# Patient Record
Sex: Female | Born: 1965 | Race: White | Hispanic: No | Smoking: Never smoker
Health system: Southern US, Community
[De-identification: ages and names within clinical notes are randomized; demographics above are authoritative.]

## PROBLEM LIST (undated history)

## (undated) DIAGNOSIS — F419 Anxiety disorder, unspecified: Secondary | ICD-10-CM

## (undated) DIAGNOSIS — I219 Acute myocardial infarction, unspecified: Secondary | ICD-10-CM

## (undated) DIAGNOSIS — I10 Essential (primary) hypertension: Secondary | ICD-10-CM

## (undated) DIAGNOSIS — E785 Hyperlipidemia, unspecified: Secondary | ICD-10-CM

## (undated) HISTORY — DX: Essential (primary) hypertension: I10

## (undated) HISTORY — DX: Hyperlipidemia, unspecified: E78.5

## (undated) HISTORY — DX: Anxiety disorder, unspecified: F41.9

## (undated) HISTORY — DX: Acute myocardial infarction, unspecified: I21.9

---

## 1997-09-03 ENCOUNTER — Inpatient Hospital Stay (HOSPITAL_COMMUNITY): Admission: AD | Admit: 1997-09-03 | Discharge: 1997-09-03 | Payer: Self-pay | Admitting: Obstetrics and Gynecology

## 1997-09-07 ENCOUNTER — Inpatient Hospital Stay (HOSPITAL_COMMUNITY): Admission: AD | Admit: 1997-09-07 | Discharge: 1997-09-10 | Payer: Self-pay | Admitting: Obstetrics and Gynecology

## 2000-08-22 ENCOUNTER — Emergency Department (HOSPITAL_COMMUNITY): Admission: EM | Admit: 2000-08-22 | Discharge: 2000-08-22 | Payer: Self-pay | Admitting: Internal Medicine

## 2001-01-18 ENCOUNTER — Other Ambulatory Visit: Admission: RE | Admit: 2001-01-18 | Discharge: 2001-01-18 | Payer: Self-pay | Admitting: Obstetrics & Gynecology

## 2002-02-04 ENCOUNTER — Other Ambulatory Visit: Admission: RE | Admit: 2002-02-04 | Discharge: 2002-02-04 | Payer: Self-pay | Admitting: Obstetrics & Gynecology

## 2003-02-12 ENCOUNTER — Other Ambulatory Visit: Admission: RE | Admit: 2003-02-12 | Discharge: 2003-02-12 | Payer: Self-pay | Admitting: Obstetrics & Gynecology

## 2004-03-01 ENCOUNTER — Other Ambulatory Visit: Admission: RE | Admit: 2004-03-01 | Discharge: 2004-03-01 | Payer: Self-pay | Admitting: Obstetrics & Gynecology

## 2005-05-26 ENCOUNTER — Other Ambulatory Visit: Admission: RE | Admit: 2005-05-26 | Discharge: 2005-05-26 | Payer: Self-pay | Admitting: Obstetrics & Gynecology

## 2005-06-06 ENCOUNTER — Ambulatory Visit: Payer: Self-pay | Admitting: Internal Medicine

## 2005-09-26 ENCOUNTER — Ambulatory Visit: Payer: Self-pay | Admitting: Internal Medicine

## 2007-01-28 ENCOUNTER — Encounter: Payer: Self-pay | Admitting: *Deleted

## 2007-01-28 DIAGNOSIS — I10 Essential (primary) hypertension: Secondary | ICD-10-CM | POA: Insufficient documentation

## 2008-05-08 HISTORY — PX: CHOLECYSTECTOMY: SHX55

## 2008-12-01 ENCOUNTER — Emergency Department (HOSPITAL_BASED_OUTPATIENT_CLINIC_OR_DEPARTMENT_OTHER): Admission: EM | Admit: 2008-12-01 | Discharge: 2008-12-02 | Payer: Self-pay | Admitting: Emergency Medicine

## 2008-12-02 ENCOUNTER — Ambulatory Visit: Payer: Self-pay | Admitting: Diagnostic Radiology

## 2008-12-07 ENCOUNTER — Ambulatory Visit: Payer: Self-pay | Admitting: Internal Medicine

## 2008-12-14 ENCOUNTER — Encounter: Payer: Self-pay | Admitting: Internal Medicine

## 2008-12-16 ENCOUNTER — Encounter: Admission: RE | Admit: 2008-12-16 | Discharge: 2008-12-16 | Payer: Self-pay | Admitting: General Surgery

## 2009-01-13 ENCOUNTER — Encounter (INDEPENDENT_AMBULATORY_CARE_PROVIDER_SITE_OTHER): Payer: Self-pay | Admitting: General Surgery

## 2009-01-13 ENCOUNTER — Ambulatory Visit (HOSPITAL_COMMUNITY): Admission: RE | Admit: 2009-01-13 | Discharge: 2009-01-13 | Payer: Self-pay | Admitting: General Surgery

## 2009-02-01 ENCOUNTER — Encounter: Payer: Self-pay | Admitting: Internal Medicine

## 2009-03-22 ENCOUNTER — Ambulatory Visit: Payer: Self-pay | Admitting: Internal Medicine

## 2009-03-22 DIAGNOSIS — J309 Allergic rhinitis, unspecified: Secondary | ICD-10-CM | POA: Insufficient documentation

## 2009-03-23 LAB — CONVERTED CEMR LAB: Pap Smear: NORMAL

## 2010-01-05 ENCOUNTER — Telehealth: Payer: Self-pay | Admitting: Internal Medicine

## 2010-03-04 ENCOUNTER — Ambulatory Visit: Payer: Self-pay | Admitting: Internal Medicine

## 2010-03-04 DIAGNOSIS — E785 Hyperlipidemia, unspecified: Secondary | ICD-10-CM | POA: Insufficient documentation

## 2010-03-04 LAB — CONVERTED CEMR LAB
AST: 16 units/L (ref 0–37)
BUN: 12 mg/dL (ref 6–23)
Basophils Absolute: 0 10*3/uL (ref 0.0–0.1)
Calcium: 9.3 mg/dL (ref 8.4–10.5)
Cholesterol: 178 mg/dL (ref 0–200)
Eosinophils Absolute: 0 10*3/uL (ref 0.0–0.7)
GFR calc non Af Amer: 105.5 mL/min (ref >60)
Glucose, Bld: 76 mg/dL (ref 70–99)
HCT: 40 % (ref 36.0–46.0)
HDL: 59.6 mg/dL (ref >39.00)
Lymphocytes Relative: 28.7 % (ref 12.0–46.0)
Lymphs Abs: 2.2 10*3/uL (ref 0.7–4.0)
MCHC: 34.1 g/dL (ref 30.0–36.0)
Monocytes Relative: 7.2 % (ref 3.0–12.0)
Platelets: 202 10*3/uL (ref 150.0–400.0)
RDW: 12.9 % (ref 11.5–14.6)
TSH: 1.68 microintl units/mL (ref 0.35–5.50)
Total Bilirubin: 0.5 mg/dL (ref 0.3–1.2)
Triglycerides: 118 mg/dL (ref 0.0–149.0)
VLDL: 23.6 mg/dL (ref 0.0–40.0)

## 2010-03-04 LAB — HM MAMMOGRAPHY

## 2010-03-06 ENCOUNTER — Encounter: Payer: Self-pay | Admitting: Internal Medicine

## 2010-06-09 NOTE — Progress Notes (Signed)
Summary: diarrhea  Phone Note Call from Patient Call back at [phone]   Caller: Patient Summary of Call: Patient called c/o diarrhea,vomiting, and unable to keep anything down x yesterday. She states that she started a zpak on 8/27 by urgant care due to spider bite and afraid to take Immodium due to interaction. Pt is requesting advisement on what she may take to help. Please advise thanks Initial call taken by: Rock Nephew CMA,  January 05, 2010 8:51 AM  Follow-up for Phone Call        go to er for iv fluids Follow-up by: Etta Grandchild MD,  January 05, 2010 8:53 AM  Additional Follow-up for Phone Call Additional follow up Details #1::        Patient notified and states that she does not want to do that so she will figure something else out. Additional Follow-up by: Lucious Groves CMA,  January 05, 2010 9:18 AM

## 2010-06-09 NOTE — Assessment & Plan Note (Signed)
Summary: FU ON MEDS/NWS   Vital Signs:  Patient profile:   45 year old female Menstrual status:  regular LMP:     02/08/2010 Height:      67 inches Weight:      143 pounds BMI:     22.48 O2 Sat:      97 % on Room air Temp:     97.6 degrees F oral Pulse rate:   59 / minute Pulse rhythm:   regular Resp:     16 per minute BP sitting:   118 / 70 Cuff size:   regular  Vitals Entered By: Jarome Lamas (March 04, 2010 11:08 AM)  O2 Flow:  Room air CC: follow-up visit, Preventive Care Is Patient Diabetic? No Pain Assessment Patient in pain? no      LMP (date): 02/08/2010     Enter LMP: 02/08/2010 Last PAP Result Normal    Primary Care Teeghan Hammer:  JOnes  CC:  follow-up visit and Preventive Care.  History of Present Illness:  Follow-Up Visit      This is a 45 year old woman who presents for Follow-up visit.  The patient denies chest pain, palpitations, dizziness, syncope, low blood sugar symptoms, high blood sugar symptoms, edema, SOB, DOE, PND, and orthopnea.  Since the last visit the patient notes no new problems or concerns.  The patient reports taking meds as prescribed, monitoring BP, and dietary compliance.  When questioned about possible medication side effects, the patient notes none.    Preventive Screening-Counseling & Management  Alcohol-Tobacco     Alcohol drinks/day: 0     Alcohol Counseling: not indicated; patient does not drink     Smoking Status: never     Tobacco Counseling: not indicated; no tobacco use  Hep-HIV-STD-Contraception     Hepatitis Risk: no risk noted     HIV Risk: no risk noted     STD Risk: no risk noted      Sexual History:  currently monogamous.        Drug Use:  never.        Blood Transfusions:  no.    Medications Prior to Update: 1)  Atenolol 50 Mg Tabs (Atenolol) .... Take 1 Tablet By Mouth Once A Day 2)  Singulair 10 Mg Tabs (Montelukast Sodium) .... Take 1 Tablet By Mouth Once A Day 3)  Flonase 50 Mcg/act Susp  (Fluticasone Propionate) .... As Needed 4)  Ocella 3-0.03 Mg Tabs (Drospirenone-Ethinyl Estradiol) .... One Daily  Current Medications (verified): 1)  Atenolol 50 Mg Tabs (Atenolol) .... Take 1 Tablet By Mouth Once A Day 2)  Singulair 10 Mg Tabs (Montelukast Sodium) .... Take 1 Tablet By Mouth Once A Day 3)  Flonase 50 Mcg/act Susp (Fluticasone Propionate) .... As Needed 4)  Ocella 3-0.03 Mg Tabs (Drospirenone-Ethinyl Estradiol) .... One Daily  Allergies (verified): 1)  ! 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 Caesarean section  Family History: Reviewed history from 12/07/2008 and no changes required. Family History  of Alcoholism/Addiction Family History High cholesterol Family History Hypertension Family History of Stroke M 1st degree relative <50 Father has CABG at 37 yoa  Social History: Reviewed history from 12/07/2008 and no changes required. 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    Physical Exam  General:  alert, well-developed, well-nourished, and well-hydrated.   Head:  normocephalic and atraumatic.   Eyes:  vision grossly intact and pupils equal.   Mouth:  Oral mucosa and oropharynx without lesions or exudates.  Teeth in good repair. Neck:  supple, full ROM, and no masses.   Lungs:  Normal respiratory effort, chest expands symmetrically. Lungs are clear to auscultation, no crackles or wheezes. Heart:  Normal rate and regular rhythm. S1 and S2 normal without gallop, murmur, click, rub or other extra sounds. Abdomen:  Bowel sounds positive,abdomen soft and non-tender without masses, organomegaly or hernias noted. Msk:  normal ROM, no joint tenderness, no joint swelling, and no joint warmth.   Extremities:  No clubbing, cyanosis, edema, or deformity noted with normal full range of motion of all joints.   Skin:  turgor normal, color normal, and no rashes.   Cervical Nodes:  no anterior cervical adenopathy and no posterior cervical adenopathy.   Psych:  Cognition and judgment appear intact. Alert and cooperative with normal attention span and concentration. No apparent delusions, illusions, hallucinations   Impression & Recommendations:  Problem # 1:  HYPERLIPIDEMIA (ICD-272.4) Assessment New  Orders: Venipuncture (56213) TLB-Lipid Panel (80061-LIPID) TLB-BMP (Basic Metabolic Panel-BMET) (80048-METABOL) TLB-CBC Platelet - w/Differential (85025-CBCD) TLB-Hepatic/Liver Function Pnl (80076-HEPATIC) TLB-TSH (Thyroid Stimulating Hormone) (84443-TSH)  Problem # 2:  HYPERTENSION (ICD-401.9) Assessment: Improved  Her updated medication list for this problem includes:    Atenolol 50 Mg Tabs (Atenolol) .Marland Kitchen... Take 1 tablet by mouth once a day  Orders: Venipuncture (08657) TLB-Lipid Panel  (80061-LIPID) TLB-BMP (Basic Metabolic Panel-BMET) (80048-METABOL) TLB-CBC Platelet - w/Differential (85025-CBCD) TLB-Hepatic/Liver Function Pnl (80076-HEPATIC) TLB-TSH (Thyroid Stimulating Hormone) (84443-TSH)  BP today: 118/70 Prior BP: 112/72 (03/22/2009)  Prior 10 Yr Risk Heart Disease: Not enough information (12/07/2008)  Complete Medication List: 1)  Atenolol 50 Mg Tabs (Atenolol) .... Take 1 tablet by mouth once a day 2)  Singulair 10 Mg Tabs (Montelukast sodium) .... Take 1 tablet by mouth once a day 3)  Flonase 50 Mcg/act Susp (Fluticasone propionate) .... As needed 4)  Ocella 3-0.03 Mg Tabs (Drospirenone-ethinyl estradiol) .... One daily  PAP Screening:    Hx Cervical Dysplasia in last 5 yrs? No    3 normal PAP smears in last 5 yrs? Yes    Last PAP smear:  03/23/2009    Reviewed PAP smear recommendations:  patient defers to GYN Judy Pollman  PAP Smear Results:    Date of Exam:  03/23/2009    Results:  Normal  Mammogram Screening:    Reviewed Mammogram recommendations:  mammogram not due yet  Osteoporosis Risk Assessment:  Risk Factors for Fracture or Low Bone Density:   Race (White or Asian):     yes   Smoking status:  never  Patient Instructions: 1)  Please schedule a follow-up appointment in 4 months. 2)  It is important that you exercise regularly at least 20 minutes 5 times a week. If you develop chest pain, have severe difficulty breathing, or feel very tired , stop exercising immediately and seek medical attention. 3)  Check your Blood Pressure regularly. If it is above 140/90: you should make an appointment. Prescriptions: FLONASE 50 MCG/ACT SUSP (FLUTICASONE PROPIONATE) as needed  #1 inh x 11   Entered and Authorized by:   Etta Grandchild MD   Signed by:   Etta Grandchild MD on 03/04/2010   Method used:   Electronically to        [facility]* (retail)       [address] [place], [place]  [address]       Ph:  [phone]       Fax: [phone]   RxID:   [id number] SINGULAIR 10 MG TABS (MONTELUKAST SODIUM) Take 1 tablet by mouth once a day  #30 x 11   Entered and Authorized by:   Etta Grandchild MD   Signed by:   Etta Grandchild MD on 03/04/2010   Method used:   Electronically to        [facility]* (retail)       [address] [place], [place]  [address]       Ph: [phone]       Fax: [phone]   RxID:   [id number] ATENOLOL 50 MG TABS (ATENOLOL) Take 1 tablet by mouth once a day  #30 x 11   Entered and Authorized by:   Etta Grandchild MD   Signed by:   Etta Grandchild MD on 03/04/2010   Method used:   Electronically to        [facility]* (retail)       [address] [place], [place]  [address]       Ph: [phone]       Fax: [phone]   RxID:   [id number]    Orders Added: 1)  Venipuncture [51761] 2)  TLB-Lipid Panel [80061-LIPID] 3)  TLB-BMP (Basic Metabolic Panel-BMET) [80048-METABOL] 4)  TLB-CBC Platelet - w/Differential [85025-CBCD] 5)  TLB-Hepatic/Liver Function Pnl [80076-HEPATIC] 6)  TLB-TSH (Thyroid Stimulating Hormone) [84443-TSH] 7)  Est. Patient Level III [60737]

## 2010-06-09 NOTE — Letter (Signed)
Summary: Lipid Letter  [facility]  [address] [place], [place] [address]   Phone: [phone]  Fax: [phone]    03/06/2010  Alexandra Swanson [address] [place], [place]  [address]  Dear Alexandra Swanson:  We have carefully reviewed your last lipid profile from 03/04/2010 and the results are noted below with a summary of recommendations for lipid management.    Cholesterol:       178     Goal: <200   HDL "good" Cholesterol:   57.84     Goal: >50   LDL "bad" Cholesterol:   95     Goal: <130   Triglycerides:       118.0     Goal: <150    other labs look good as well    TLC Diet (Therapeutic Lifestyle Change): Saturated Fats & Transfatty acids should be kept < 7% of total calories ***Reduce Saturated Fats Polyunstaurated Fat can be up to 10% of total calories Monounsaturated Fat Fat can be up to 20% of total calories Total Fat should be no greater than 25-35% of total calories Carbohydrates should be 50-60% of total calories Protein should be approximately 15% of total calories Fiber should be at least 20-30 grams a day ***Increased fiber may help lower LDL Total Cholesterol should be < 200mg /day Consider adding plant stanol/sterols to diet (example: Benacol spread) ***A higher intake of unsaturated fat may reduce Triglycerides and Increase HDL    Adjunctive Measures (may lower LIPIDS and reduce risk of Heart Attack) include: Aerobic Exercise (20-30 minutes 3-4 times a week) Limit Alcohol Consumption Weight Reduction Aspirin 75-81 mg a day by mouth (if not allergic or contraindicated) Dietary Fiber 20-30 grams a day by mouth     Current Medications: 1)    Atenolol 50 Mg Tabs (Atenolol) .... Take 1 tablet by mouth once a day 2)    Singulair 10 Mg Tabs (Montelukast sodium) .... Take 1 tablet by mouth once a day 3)    Flonase 50 Mcg/act Susp (Fluticasone propionate) .... As needed 4)    Ocella 3-0.03 Mg Tabs (Drospirenone-ethinyl estradiol) .... One daily  If  you have any questions, please call. We appreciate being able to work with you.   Sincerely,    [facility] Etta Grandchild MD

## 2010-06-17 ENCOUNTER — Ambulatory Visit (INDEPENDENT_AMBULATORY_CARE_PROVIDER_SITE_OTHER): Payer: BC Managed Care – PPO | Admitting: Internal Medicine

## 2010-06-17 ENCOUNTER — Encounter: Payer: Self-pay | Admitting: Internal Medicine

## 2010-06-17 DIAGNOSIS — F411 Generalized anxiety disorder: Secondary | ICD-10-CM | POA: Insufficient documentation

## 2010-06-17 DIAGNOSIS — L259 Unspecified contact dermatitis, unspecified cause: Secondary | ICD-10-CM | POA: Insufficient documentation

## 2010-06-23 NOTE — Assessment & Plan Note (Signed)
Summary: FU/ RASH/ MOLE/NWS  #   Vital Signs:  Patient profile:   45 year old female Menstrual status:  regular Height:      67 inches Weight:      142 pounds BMI:     22.32 O2 Sat:      96 % on Room air Temp:     98.2 degrees F oral Pulse rate:   63 / minute Pulse rhythm:   regular Resp:     16 per minute BP sitting:   106 / 70  (left arm) Cuff size:   regular  Vitals Entered By: Rock Nephew CMA (June 17, 2010 8:19 AM)  O2 Flow:  Room air CC: Pt c/o bilat arm rash, possible ringworm on L buttock, and R side mole/ color change, Rash Is Patient Diabetic? No Pain Assessment Patient in pain? no        Primary Care Provider:  Zayana Salvador  CC:  Pt c/o bilat arm rash, possible ringworm on L buttock, and R side mole/ color change, and Rash.  History of Present Illness:  Rash      This is a 45 year old woman who presents with Rash.  The symptoms began 2 weeks ago.  The severity is described as mild.  The patient reports papules, itching, and scaling, but denies macules, nodules, hives, welts, pustules, blisters, ulcers, weeping, oozing, redness, increased warmth, and tenderness.  The rash is located on the right arm and left arm.  The rash is worse with heat and worse with scratching.  The patient denies the following symptoms: fever, headache, facial swelling, tongue swelling, burning, difficulty breathing, abdominal pain, nausea, vomiting, diarrhea, dizziness, sore throat, dysuria, eye symptoms, arthralgias, and vaginal discharge.  The patient denies history of recent tick bite, recent tick exposure, other insect bite, recent infection, recent antibiotic use, new medication, new clothing, new topical exposure, recent travel, pet/animal contact, thyroid disease, chronic liver disease, autoimmune disease, chronic edema, and prior STD.  She is using Lamisil Cream.  Current Medications (verified): 1)  Atenolol 50 Mg Tabs (Atenolol) .... Take 1 Tablet By Mouth Once A Day 2)  Singulair  10 Mg Tabs (Montelukast Sodium) .... Take 1 Tablet By Mouth Once A Day 3)  Flonase 50 Mcg/act Susp (Fluticasone Propionate) .... As Needed 4)  Loestrin 24 Fe 1-20 Mg-Mcg Tabs (Norethin Ace-Eth Estrad-Fe) 5)  Aspirin 81 Mg Tbec (Aspirin) 6)  Xanax 0.25 Mg Tabs (Alprazolam)  Allergies (verified): 1)  ! 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 Family History of Alcoholism/Addiction Family History High cholesterol Family History Hypertension Family History of Stroke M 1st degree relative <50 Father has CABG at 39 yoa  Social History: Reviewed history from 12/07/2008 and no changes required. 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   Psych:  Complains of anxiety; denies depression, easily angered, easily tearful, irritability, mental problems, panic attacks, sense of great danger, suicidal thoughts/plans, thoughts of violence, unusual visions or sounds, and thoughts /plans of harming others.  Physical  Exam  General:  alert, well-developed, well-nourished, and well-hydrated.   Head:  normocephalic and atraumatic.   Mouth:  Oral mucosa and oropharynx without lesions or exudates.  Teeth in good repair. Neck:  supple, full ROM, and no masses.   Lungs:  Normal respiratory effort, chest expands symmetrically. Lungs are clear to auscultation, no crackles or wheezes. Heart:  Normal rate and regular rhythm. S1 and S2 normal without gallop, murmur, click, rub or other extra sounds. Abdomen:  Bowel sounds positive,abdomen soft and non-tender without masses, organomegaly or hernias noted. Msk:  normal ROM, no joint tenderness, no joint swelling, and no joint warmth.   Pulses:  R and L carotid,radial,femoral,dorsalis pedis and posterior tibial pulses are full and equal bilaterally Extremities:  No clubbing, cyanosis, edema, or deformity noted with normal full range of motion of all joints.   Neurologic:  No cranial nerve deficits noted. Station and gait are normal. Plantar reflexes are down-going bilaterally. DTRs are symmetrical throughout. Sensory, motor and coordinative functions appear intact. Skin:  seborrheic keratosis and papular rash  with erythema and scale on both forearms and a round red macule on her left buttocks.   Cervical Nodes:  no anterior cervical adenopathy and no posterior cervical adenopathy.   Axillary Nodes:  no R axillary adenopathy and no L axillary adenopathy.   Psych:  Oriented X3, memory intact for recent and remote, normally interactive, good eye contact, not anxious appearing, not depressed appearing, not agitated, and not suicidal.     Impression & Recommendations:  Problem # 1:  ECZEMA (ICD-692.9) Assessment New  Her updated medication list for this problem includes:    Clobetasol Propionate 0.05 % Crea (Clobetasol propionate) .Marland Kitchen... Apply to aa two times a day for 14 days  Problem # 2:  ANXIETY DISORDER, GENERALIZED (ICD-300.02) Assessment: New  Her updated  medication list for this problem includes:    Xanax 0.25 Mg Tabs (Alprazolam) .Marland Kitchen... Take one by mouth two times a day as needed for anxiety  Problem # 3:  HYPERTENSION (ICD-401.9) Assessment: Improved  Her updated medication list for this problem includes:    Atenolol 50 Mg Tabs (Atenolol) .Marland Kitchen... Take 1 tablet by mouth once a day  BP today: 106/70 Prior BP: 118/70 (03/04/2010)  Prior 10 Yr Risk Heart Disease: Not enough information (12/07/2008)  Labs Reviewed: K+: 5.4 (03/04/2010) Creat: : 0.7 (03/04/2010)   Chol: 178 (03/04/2010)   HDL: 59.60 (03/04/2010)   LDL: 95 (03/04/2010)   TG: 118.0 (03/04/2010)  Complete Medication List: 1)  Atenolol 50 Mg Tabs (Atenolol) .... Take 1 tablet by mouth once a day 2)  Singulair 10 Mg Tabs (Montelukast sodium) .... Take 1 tablet by mouth once a day 3)  Flonase 50 Mcg/act Susp (Fluticasone propionate) .... As needed 4)  Loestrin 24 Fe 1-20 Mg-mcg Tabs (Norethin ace-eth estrad-fe) 5)  Aspirin 81 Mg Tbec (Aspirin) 6)  Xanax 0.25 Mg Tabs (Alprazolam) .... Take one by mouth two times a day as needed for anxiety 7)  Clobetasol Propionate 0.05 % Crea (Clobetasol propionate) .... Apply to aa two times a day for 14 days  Patient Instructions: 1)  Please schedule a follow-up appointment in 1 month. Prescriptions: CLOBETASOL PROPIONATE 0.05 % CREA (CLOBETASOL PROPIONATE)  Apply to AA two times a day for 14 days  #30 gms x 2   Entered and Authorized by:   Etta Grandchild MD   Signed by:   Etta Grandchild MD on 06/17/2010   Method used:   Print then Give to Patient   RxID:   [id number] Prudy Feeler 0.25 MG TABS (ALPRAZOLAM) Take one by mouth two times a day as needed for anxiety  #60 x 5   Entered and Authorized by:   Etta Grandchild MD   Signed by:   Etta Grandchild MD on 06/17/2010   Method used:   Print then Give to Patient   RxID:   [id number]    Orders Added: 1)  Est. Patient Level III [28413]

## 2010-08-12 LAB — DIFFERENTIAL
Basophils Absolute: 0 10*3/uL (ref 0.0–0.1)
Basophils Relative: 0 % (ref 0–1)
Eosinophils Absolute: 0 10*3/uL (ref 0.0–0.7)
Eosinophils Relative: 0 % (ref 0–5)
Neutrophils Relative %: 67 % (ref 43–77)

## 2010-08-12 LAB — COMPREHENSIVE METABOLIC PANEL
ALT: 13 U/L (ref 0–35)
AST: 19 U/L (ref 0–37)
Alkaline Phosphatase: 43 U/L (ref 39–117)
CO2: 26 mEq/L (ref 19–32)
Calcium: 8.7 mg/dL (ref 8.4–10.5)
Chloride: 106 mEq/L (ref 96–112)
GFR calc Af Amer: >60 mL/min (ref >60)
GFR calc non Af Amer: >60 mL/min (ref >60)
Glucose, Bld: 94 mg/dL (ref 70–99)
Potassium: 3.7 mEq/L (ref 3.5–5.1)
Sodium: 137 mEq/L (ref 135–145)
Total Bilirubin: 0.4 mg/dL (ref 0.3–1.2)

## 2010-08-12 LAB — CBC
Hemoglobin: 13.6 g/dL (ref 12.0–15.0)
MCHC: 33.8 g/dL (ref 30.0–36.0)
RBC: 4.63 MIL/uL (ref 3.87–5.11)
WBC: 7.3 10*3/uL (ref 4.0–10.5)

## 2010-08-14 LAB — URINALYSIS, ROUTINE W REFLEX MICROSCOPIC
Bilirubin Urine: NEGATIVE
Ketones, ur: NEGATIVE mg/dL
Nitrite: NEGATIVE
Protein, ur: NEGATIVE mg/dL
pH: 7 (ref 5.0–8.0)

## 2010-08-14 LAB — COMPREHENSIVE METABOLIC PANEL
Alkaline Phosphatase: 66 U/L (ref 39–117)
BUN: 15 mg/dL (ref 6–23)
Chloride: 103 mEq/L (ref 96–112)
Creatinine, Ser: 0.6 mg/dL (ref 0.4–1.2)
Glucose, Bld: 102 mg/dL — ABNORMAL HIGH (ref 70–99)
Potassium: 3.8 mEq/L (ref 3.5–5.1)
Total Bilirubin: 0.2 mg/dL — ABNORMAL LOW (ref 0.3–1.2)

## 2010-08-14 LAB — PREGNANCY, URINE: Preg Test, Ur: NEGATIVE

## 2010-08-14 LAB — CBC
HCT: 40.8 % (ref 36.0–46.0)
Hemoglobin: 14 g/dL (ref 12.0–15.0)
MCV: 85.1 fL (ref 78.0–100.0)
Platelets: 206 10*3/uL (ref 150–400)
RDW: 12.3 % (ref 11.5–15.5)

## 2010-08-14 LAB — DIFFERENTIAL
Basophils Absolute: 0.1 10*3/uL (ref 0.0–0.1)
Basophils Relative: 1 % (ref 0–1)
Lymphocytes Relative: 39 % (ref 12–46)
Neutro Abs: 4.4 10*3/uL (ref 1.7–7.7)
Neutrophils Relative %: 53 % (ref 43–77)

## 2010-08-14 LAB — LIPASE, BLOOD: Lipase: 86 U/L (ref 23–300)

## 2010-08-14 LAB — URINE MICROSCOPIC-ADD ON

## 2011-01-02 ENCOUNTER — Telehealth: Payer: Self-pay | Admitting: *Deleted

## 2011-01-02 NOTE — Telephone Encounter (Signed)
Spoke w/pt -she c/o 2 large mosquito bites on her foot. Swelling has decreased but she has never had bites to be as large as these and wanted to know what to look for. She will continue the otc anti itch cream and antihistamine. Advised to see MD w/fever, swelling, pain, drainage or any signs of infection or if area did not improve, pt agreed.

## 2011-01-24 ENCOUNTER — Encounter: Payer: Self-pay | Admitting: Internal Medicine

## 2011-01-26 ENCOUNTER — Encounter: Payer: Self-pay | Admitting: Internal Medicine

## 2011-01-26 ENCOUNTER — Ambulatory Visit (INDEPENDENT_AMBULATORY_CARE_PROVIDER_SITE_OTHER): Payer: No Typology Code available for payment source | Admitting: Internal Medicine

## 2011-01-26 VITALS — BP 128/80 | HR 60 | Temp 98.1°F | Resp 16 | Wt 147.0 lb

## 2011-01-26 DIAGNOSIS — I10 Essential (primary) hypertension: Secondary | ICD-10-CM

## 2011-01-26 DIAGNOSIS — J309 Allergic rhinitis, unspecified: Secondary | ICD-10-CM | POA: Insufficient documentation

## 2011-01-26 DIAGNOSIS — J3089 Other allergic rhinitis: Secondary | ICD-10-CM

## 2011-01-26 MED ORDER — METHYLPREDNISOLONE 4 MG PO KIT: PACK | ORAL | Status: AC

## 2011-01-26 NOTE — Progress Notes (Signed)
Subjective:    Patient ID: Alexandra Swanson, female    DOB: 1965-10-04, 45 y.o.   MRN: [id number]  Hypertension This is a chronic problem. The current episode started more than 1 year ago. The problem is unchanged. The problem is controlled. Associated symptoms include malaise/fatigue. Pertinent negatives include no anxiety, blurred vision, chest pain, headaches, neck pain, orthopnea, palpitations, peripheral edema, PND, shortness of breath or sweats. There are no associated agents to hypertension. Past treatments include beta blockers. The current treatment provides significant improvement. There are no compliance problems.       Review of Systems  Constitutional: Positive for malaise/fatigue and fatigue. Negative for fever, chills, diaphoresis, activity change, appetite change and unexpected weight change.  HENT: Positive for congestion, rhinorrhea, sneezing, postnasal drip and sinus pressure. Negative for hearing loss, ear pain, nosebleeds, sore throat, facial swelling, mouth sores, trouble swallowing, neck pain, neck stiffness, voice change, tinnitus and ear discharge.   Eyes: Negative.  Negative for blurred vision.  Respiratory: Negative for apnea, cough, choking, chest tightness, shortness of breath, wheezing and stridor.   Cardiovascular: Negative for chest pain, palpitations, orthopnea, leg swelling and PND.  Gastrointestinal: Negative for nausea, vomiting, abdominal pain, diarrhea, constipation, blood in stool, abdominal distention, anal bleeding and rectal pain.  Genitourinary: Negative.   Musculoskeletal: Negative for myalgias, back pain, joint swelling, arthralgias and gait problem.  Skin: Negative for color change, pallor, rash and wound.  Neurological: Negative for dizziness, tremors, seizures, syncope, facial asymmetry, speech difficulty, weakness, light-headedness, numbness and headaches.  Hematological: Negative for adenopathy. Does not bruise/bleed easily.    Psychiatric/Behavioral: Negative.        Objective:   Physical Exam  Vitals reviewed. Constitutional: She is oriented to person, place, and time. She appears well-developed and well-nourished. No distress.  HENT:  Head: Normocephalic and atraumatic. No trismus in the jaw.  Right Ear: Hearing, tympanic membrane, external ear and ear canal normal.  Left Ear: Hearing, tympanic membrane, external ear and ear canal normal. No decreased hearing is noted.  Nose: Mucosal edema and rhinorrhea present. No nose lacerations, sinus tenderness, nasal deformity, septal deviation or nasal septal hematoma. No epistaxis.  No foreign bodies. Right sinus exhibits no maxillary sinus tenderness and no frontal sinus tenderness. Left sinus exhibits no maxillary sinus tenderness and no frontal sinus tenderness.  Mouth/Throat: Oropharynx is clear and moist and mucous membranes are normal. Mucous membranes are not pale, not dry and not cyanotic. No uvula swelling. No oropharyngeal exudate, posterior oropharyngeal edema, posterior oropharyngeal erythema or tonsillar abscesses.  Eyes: Conjunctivae are normal. Right eye exhibits no discharge. Left eye exhibits no discharge. No scleral icterus.  Neck: Normal range of motion. Neck supple. No JVD present. No tracheal deviation present. No thyromegaly present.  Cardiovascular: Normal rate, regular rhythm, normal heart sounds and intact distal pulses.  Exam reveals no gallop and no friction rub.   No murmur heard. Pulmonary/Chest: Effort normal and breath sounds normal. No stridor. No respiratory distress. She has no wheezes. She has no rales. She exhibits no tenderness.  Abdominal: Soft. Bowel sounds are normal. She exhibits no distension and no mass. There is no tenderness. There is no rebound and no guarding.  Musculoskeletal: Normal range of motion. She exhibits no edema and no tenderness.  Lymphadenopathy:    She has no cervical adenopathy.  Neurological: She is oriented  to person, place, and time. She displays normal reflexes. She exhibits normal muscle tone. Coordination normal.  Skin: Skin is warm and dry. No rash  noted. She is not diaphoretic. No erythema. No pallor.  Psychiatric: She has a normal mood and affect. Her behavior is normal. Judgment and thought content normal.          Assessment & Plan:

## 2011-01-26 NOTE — Patient Instructions (Signed)
Hypertension (High Blood Pressure) As your heart beats, it forces blood through your arteries. This force is your blood pressure. If the pressure is too high, it is called hypertension (HTN) or high blood pressure. HTN is dangerous because you may have it and not know it. High blood pressure may mean that your heart has to work harder to pump blood. Your arteries may be narrow or stiff. The extra work puts you at risk for heart disease, stroke, and other problems.  Blood pressure consists of two numbers, a higher number over a lower, 110/72, for example. It is stated as "110 over 72." The ideal is below 120 for the top number (systolic) and under 80 for the bottom (diastolic). Write down your blood pressure today. You should pay close attention to your blood pressure if you have certain conditions such as:  Heart failure.  Prior heart attack.   Diabetes   Chronic kidney disease.   Prior stroke.   Multiple risk factors for heart disease.   To see if you have HTN, your blood pressure should be measured while you are seated with your arm held at the level of the heart. It should be measured at least twice. A one-time elevated blood pressure reading (especially in the Emergency Department) does not mean that you need treatment. There may be conditions in which the blood pressure is different between your right and left arms. It is important to see your caregiver soon for a recheck. Most people have essential hypertension which means that there is not a specific cause. This type of high blood pressure may be lowered by changing lifestyle factors such as:  Stress.  Smoking.   Lack of exercise.   Excessive weight.  Drug/tobacco/alcohol use.   Eating less salt.   Most people do not have symptoms from high blood pressure until it has caused damage to the body. Effective treatment can often prevent, delay or reduce that damage. TREATMENT Treatment for high blood pressure, when a cause has been  identified, is directed at the cause. There are a large number of medications to treat HTN. These fall into several categories, and your caregiver will help you select the medicines that are best for you. Medications may have side effects. You should review side effects with your caregiver. If your blood pressure stays high after you have made lifestyle changes or started on medicines,   Your medication(s) may need to be changed.   Other problems may need to be addressed.   Be certain you understand your prescriptions, and know how and when to take your medicine.   Be sure to follow up with your caregiver within the time frame advised (usually within two weeks) to have your blood pressure rechecked and to review your medications.   If you are taking more than one medicine to lower your blood pressure, make sure you know how and at what times they should be taken. Taking two medicines at the same time can result in blood pressure that is too low.  SEEK IMMEDIATE MEDICAL CARE IF YOU DEVELOP:  A severe headache, blurred or changing vision, or confusion.   Unusual weakness or numbness, or a faint feeling.   Severe chest or abdominal pain, vomiting, or breathing problems.  MAKE SURE YOU:   Understand these instructions.   Will watch your condition.   Will get help right away if you are not doing well or get worse.  Document Released: 04/24/2005 Document Re-Released: 10/12/2009 Exit[employer] Patient Information 2011 [employer].Allergic Rhinitis Allergic rhinitis is when the mucous membranes in the nose respond to allergens. Allergens are particles in the air that cause your body to have an allergic reaction. This causes you to release allergic antibodies. Through a chain of events, these eventually cause you to release histamine into the blood stream (hence the use of antihistamines). Although meant to be protective to the body, it is this release that causes your discomfort, such as frequent  sneezing, congestion and an itchy runny nose.  CAUSES The pollen allergens may come from grasses, trees, and weeds. This is seasonal allergic rhinitis, or "hay fever." Other allergens cause year-round allergic rhinitis (perennial allergic rhinitis) such as house dust mite allergen, pet dander and mold spores.  SYMPTOMS  Nasal stuffiness (congestion).   Runny, itchy nose with sneezing and tearing of the eyes.   There is often an itching of the mouth, eyes and ears.  It cannot be cured, but it can be controlled with medications. DIAGNOSIS If you are unable to determine the offending allergen, skin or blood testing may find it. TREATMENT  Avoid the allergen.   Medications and allergy shots (immunotherapy) can help.   Hay fever may often be treated with antihistamines in pill or nasal spray forms. Antihistamines block the effects of histamine. There are over-the-counter medicines that may help with nasal congestion and swelling around the eyes. Check with your caregiver before taking or giving this medicine.  If the treatment above does not work, there are many new medications your caregiver can prescribe. Stronger medications may be used if initial measures are ineffective. Desensitizing injections can be used if medications and avoidance fails. Desensitization is when a patient is given ongoing shots until the body becomes less sensitive to the allergen. Make sure you follow up with your caregiver if problems continue. SEEK MEDICAL CARE IF:   You develop fever (more than 100.5F (38.1 C).   You develop a cough that does not stop easily (persistent).   You have shortness of breath.   You start wheezing.   Symptoms interfere with normal daily activities.  Document Released: 01/17/2001 Document Re-Released: 05/16/2009 Exit[employer] Patient Information 2011 [employer]. 

## 2011-01-26 NOTE — Assessment & Plan Note (Signed)
She will take a course of medrol dose pak to treat the symptoms

## 2011-01-26 NOTE — Assessment & Plan Note (Signed)
I am concerned that the beta-blocker may be causing her fatigue so I have asked her to try to taper off of the atenolol and will consider a different BP med in the future

## 2011-02-02 ENCOUNTER — Telehealth: Payer: Self-pay | Admitting: *Deleted

## 2011-02-02 NOTE — Telephone Encounter (Signed)
Pt is attempting to "wean" off atenolol. See previous phone note, MD feels med may be causing fatigue. Pt is worried about elevated BP when taking less medication.   Spoke w/pt - she is slowly decreasing atenolol dose, no change in fatigue yet. BP this AM 145/86. Advised pt that if BP remained 140/90 or higher or she had any symptoms to come in for eval, pt agreed.

## 2011-02-02 NOTE — Telephone Encounter (Signed)
Agree with advice

## 2011-03-23 ENCOUNTER — Other Ambulatory Visit: Payer: Self-pay | Admitting: Internal Medicine

## 2011-03-24 ENCOUNTER — Ambulatory Visit (INDEPENDENT_AMBULATORY_CARE_PROVIDER_SITE_OTHER): Payer: No Typology Code available for payment source | Admitting: Internal Medicine

## 2011-03-24 ENCOUNTER — Encounter: Payer: Self-pay | Admitting: Internal Medicine

## 2011-03-24 VITALS — BP 140/92 | HR 80 | Temp 98.5°F | Resp 16 | Ht 67.0 in | Wt 148.0 lb

## 2011-03-24 DIAGNOSIS — Z23 Encounter for immunization: Secondary | ICD-10-CM

## 2011-03-24 DIAGNOSIS — I10 Essential (primary) hypertension: Secondary | ICD-10-CM

## 2011-03-24 DIAGNOSIS — J3089 Other allergic rhinitis: Secondary | ICD-10-CM

## 2011-03-24 MED ORDER — FLUTICASONE PROPIONATE 50 MCG/ACT NA SUSP: 2.0000 | NASAL | Status: DC | PRN

## 2011-03-24 MED ORDER — OLMESARTAN MEDOXOMIL 20 MG PO TABS: 20.0000 mg | ORAL_TABLET | Freq: Every day | ORAL | Status: DC

## 2011-03-24 NOTE — Assessment & Plan Note (Signed)
Continue flonase 

## 2011-03-24 NOTE — Progress Notes (Signed)
  Subjective:    Patient ID: Alexandra Swanson, female    DOB: 06/04/1965, 45 y.o.   MRN: [id number]  Hypertension This is a chronic problem. The current episode started more than 1 year ago. The problem is unchanged. The problem is uncontrolled. Pertinent negatives include no anxiety, blurred vision, chest pain, headaches, malaise/fatigue, neck pain, orthopnea, palpitations, peripheral edema, PND, shortness of breath or sweats. There are no associated agents to hypertension. Past treatments include beta blockers. The current treatment provides mild improvement. There are no compliance problems.       Review of Systems  Constitutional: Negative for fever, chills, malaise/fatigue, diaphoresis, activity change, appetite change, fatigue and unexpected weight change.  HENT: Positive for congestion, rhinorrhea and postnasal drip. Negative for sore throat, sneezing, neck pain and sinus pressure.   Eyes: Negative.  Negative for blurred vision.  Respiratory: Negative for apnea, cough, choking, chest tightness, shortness of breath, wheezing and stridor.   Cardiovascular: Negative for chest pain, palpitations, orthopnea, leg swelling and PND.  Gastrointestinal: Negative for nausea, vomiting, abdominal pain, diarrhea and constipation.  Genitourinary: Negative for dysuria, frequency, hematuria, flank pain, enuresis, difficulty urinating and dyspareunia.  Musculoskeletal: Negative for myalgias, back pain, joint swelling, arthralgias and gait problem.  Skin: Negative.   Neurological: Negative for dizziness, tremors, seizures, syncope, facial asymmetry, speech difficulty, weakness, light-headedness, numbness and headaches.  Hematological: Negative for adenopathy. Does not bruise/bleed easily.  Psychiatric/Behavioral: Negative.        Objective:   Physical Exam  Vitals reviewed. Constitutional: She is oriented to person, place, and time. She appears well-developed and well-nourished. No distress.  HENT:    Head: Normocephalic and atraumatic.  Mouth/Throat: Oropharynx is clear and moist. No oropharyngeal exudate.  Eyes: Conjunctivae are normal. Right eye exhibits no discharge. Left eye exhibits no discharge. No scleral icterus.  Neck: Normal range of motion. Neck supple. No JVD present. No tracheal deviation present. No thyromegaly present.  Cardiovascular: Normal rate, regular rhythm, normal heart sounds and intact distal pulses.  Exam reveals no gallop and no friction rub.   Pulmonary/Chest: Effort normal and breath sounds normal. No respiratory distress. She has no wheezes. She has no rales. She exhibits no tenderness.  Abdominal: Soft. Bowel sounds are normal. She exhibits no distension and no mass. There is no tenderness. There is no rebound and no guarding.  Musculoskeletal: Normal range of motion. She exhibits no edema and no tenderness.  Lymphadenopathy:    She has no cervical adenopathy.  Neurological: She is oriented to person, place, and time.  Skin: Skin is warm and dry. No rash noted. She is not diaphoretic. No erythema. No pallor.  Psychiatric: She has a normal mood and affect. Her behavior is normal. Judgment and thought content normal.          Assessment & Plan:

## 2011-03-24 NOTE — Assessment & Plan Note (Signed)
She has been able to wean off the atenolol so I will start benicar since I think she has high renin hypertension

## 2011-03-24 NOTE — Patient Instructions (Signed)

## 2011-04-07 ENCOUNTER — Telehealth: Payer: Self-pay

## 2011-04-07 NOTE — Telephone Encounter (Signed)
Pt called stating she has sxs of sinus inf - nasal pressure and pain and mild ear pressure. Pt was Rx'd Predpak in Aug but did not need it. She would like to know if it would be okay to take medication now? Okay to take with Benicar?Marland Kitchen

## 2011-04-07 NOTE — Telephone Encounter (Signed)
Yes, she can take the steroids and no it does not interact with benicar

## 2011-04-07 NOTE — Telephone Encounter (Signed)
She was Rx;d Prednisone by you in Aug but she did not take it. She would like to know if it is okay to take now for sinus infection sxs (ear and sinus pain/pressure). She also wants to be sure that it will not interact with Benicar. Pt declines OV at this time.

## 2011-04-07 NOTE — Telephone Encounter (Signed)
Pt advised of MD recommendation

## 2011-04-07 NOTE — Telephone Encounter (Signed)
Very confusing message, ask her to come in for a visit

## 2011-04-14 ENCOUNTER — Other Ambulatory Visit (INDEPENDENT_AMBULATORY_CARE_PROVIDER_SITE_OTHER): Payer: No Typology Code available for payment source

## 2011-04-14 ENCOUNTER — Ambulatory Visit (INDEPENDENT_AMBULATORY_CARE_PROVIDER_SITE_OTHER): Payer: No Typology Code available for payment source | Admitting: Internal Medicine

## 2011-04-14 ENCOUNTER — Encounter: Payer: Self-pay | Admitting: Internal Medicine

## 2011-04-14 ENCOUNTER — Telehealth: Payer: Self-pay | Admitting: *Deleted

## 2011-04-14 VITALS — BP 134/82 | HR 98 | Temp 97.0°F | Wt 146.0 lb

## 2011-04-14 DIAGNOSIS — R002 Palpitations: Secondary | ICD-10-CM

## 2011-04-14 DIAGNOSIS — I10 Essential (primary) hypertension: Secondary | ICD-10-CM

## 2011-04-14 LAB — COMPREHENSIVE METABOLIC PANEL
ALT: 16 U/L (ref 0–35)
AST: 18 U/L (ref 0–37)
Alkaline Phosphatase: 41 U/L (ref 39–117)
Potassium: 4 mEq/L (ref 3.5–5.1)
Sodium: 140 mEq/L (ref 135–145)
Total Bilirubin: 0.5 mg/dL (ref 0.3–1.2)
Total Protein: 7.2 g/dL (ref 6.0–8.3)

## 2011-04-14 LAB — TSH: TSH: 1.66 u[IU]/mL (ref 0.35–5.50)

## 2011-04-14 NOTE — Telephone Encounter (Signed)
Ask her if she will come in for an EKG 

## 2011-04-14 NOTE — Assessment & Plan Note (Signed)
Her BP is well controlled, I will recheck her lytes and renal function

## 2011-04-14 NOTE — Progress Notes (Signed)
  Subjective:    Patient ID: Alexandra Swanson, female    DOB: 1965-07-09, 45 y.o.   MRN: [id number]  Palpitations  This is a new problem. The current episode started in the past 7 days. The problem occurs intermittently. The problem has been unchanged. On average, each episode lasts 1 minute. Exacerbated by: medrol dose pak. Pertinent negatives include no anxiety, chest fullness, chest pain, coughing, diaphoresis, dizziness, fever, irregular heartbeat, malaise/fatigue, nausea, near-syncope, numbness, shortness of breath, syncope, vomiting or weakness. She has tried nothing for the symptoms.      Review of Systems  Constitutional: Negative for fever, chills, malaise/fatigue, diaphoresis, activity change, appetite change, fatigue and unexpected weight change.  HENT: Negative.   Eyes: Negative.   Respiratory: Negative for apnea, cough, choking, chest tightness, shortness of breath, wheezing and stridor.   Cardiovascular: Positive for palpitations. Negative for chest pain, leg swelling, syncope and near-syncope.  Gastrointestinal: Negative for nausea, vomiting and abdominal pain.  Genitourinary: Negative.   Musculoskeletal: Negative.   Skin: Negative for color change, pallor, rash and wound.  Neurological: Negative for dizziness, tremors, seizures, syncope, facial asymmetry, speech difficulty, weakness, light-headedness, numbness and headaches.  Hematological: Negative for adenopathy. Does not bruise/bleed easily.  Psychiatric/Behavioral: Negative.        Objective:   Physical Exam  Vitals reviewed. Constitutional: She is oriented to person, place, and time. She appears well-developed and well-nourished. No distress.  HENT:  Head: Normocephalic and atraumatic.  Mouth/Throat: Oropharynx is clear and moist. No oropharyngeal exudate.  Eyes: Conjunctivae are normal. Right eye exhibits no discharge. Left eye exhibits no discharge. No scleral icterus.  Neck: Normal range of motion. Neck supple.  No JVD present. No tracheal deviation present. No thyromegaly present.  Cardiovascular: Normal rate, regular rhythm, normal heart sounds and intact distal pulses.  Exam reveals no gallop and no friction rub.   No murmur heard. Pulmonary/Chest: Effort normal and breath sounds normal. No stridor. No respiratory distress. She has no wheezes. She has no rales. She exhibits no tenderness.  Abdominal: Soft. Bowel sounds are normal. She exhibits no distension and no mass. There is no tenderness. There is no rebound and no guarding.  Musculoskeletal: Normal range of motion. She exhibits no edema and no tenderness.  Lymphadenopathy:    She has no cervical adenopathy.  Neurological: She is oriented to person, place, and time.  Skin: Skin is warm and dry. No rash noted. She is not diaphoretic. No erythema. No pallor.  Psychiatric: She has a normal mood and affect. Her behavior is normal. Judgment and thought content normal.      Lab Results  Component Value Date   WBC 7.6 03/04/2010   HGB 13.7 03/04/2010   HCT 40.0 03/04/2010   PLT 202.0 03/04/2010   GLUCOSE 76 03/04/2010   CHOL 178 03/04/2010   TRIG 118.0 03/04/2010   HDL 59.60 03/04/2010   LDLCALC 95 03/04/2010   ALT 12 03/04/2010   AST 16 03/04/2010   NA 137 03/04/2010   K 5.4* 03/04/2010   CL 103 03/04/2010   CREATININE 0.7 03/04/2010   BUN 12 03/04/2010   CO2 28 03/04/2010   TSH 1.68 03/04/2010      Assessment & Plan:

## 2011-04-14 NOTE — Telephone Encounter (Signed)
Pt was seen by Dr. Jones today. °

## 2011-04-14 NOTE — Telephone Encounter (Signed)
Pt called and states since starting on prednisone she has been experiencing heart palpitations daily. She finished prednisone on Tues Morning and had still been having palpitations. She states symptoms are intermittent. She denies dizziness, or chest pain.

## 2011-04-14 NOTE — Patient Instructions (Signed)
Palpitations  A palpitation is the feeling that your heartbeat is irregular or is faster than normal. Although this is frightening, it usually is not serious. Palpitations may be caused by excesses of smoking, caffeine, or alcohol. They are also brought on by stress and anxiety. Sometimes, they are caused by heart disease. Unless otherwise noted, your caregiver did not find any signs of serious illness at this time. HOME CARE INSTRUCTIONS  To help prevent palpitations:  Drink decaffeinated coffee, tea, and soda pop. Avoid chocolate.   If you smoke or drink alcohol, quit or cut down as much as possible.   Reduce your stress or anxiety level. Biofeedback, yoga, or meditation will help you relax. Physical activity such as swimming, jogging, or walking also may be helpful.  SEEK MEDICAL CARE IF:   You continue to have a fast heartbeat.   Your palpitations occur more often.  SEEK IMMEDIATE MEDICAL CARE IF: You develop chest pain, shortness of breath, severe headache, dizziness, or fainting. Document Released: 04/21/2000 Document Revised: 01/04/2011 Document Reviewed: 06/21/2007 [employer] Patient Information 2012 [employer]. 

## 2011-04-14 NOTE — Assessment & Plan Note (Signed)
Her EKG shows some benign PAC's but no significant arrhythmia - I think this has been caused by the steroid therapy, I will recheck her lytes and TSH, I expect her to improve since the steroids have been stopped

## 2011-04-16 ENCOUNTER — Encounter: Payer: Self-pay | Admitting: Internal Medicine

## 2011-04-17 ENCOUNTER — Telehealth: Payer: Self-pay

## 2011-04-17 NOTE — Telephone Encounter (Signed)
Returned call to patient//[facility] advising letter mailed

## 2011-04-17 NOTE — Telephone Encounter (Signed)
Pt called for results of recent labs

## 2011-06-06 IMAGING — CT CT ABDOMEN W/O CM
2 of 4 series · 16 of 46 positions shown, 18 images · non-contrast
Comparison: None

CT ABDOMEN

CLINICAL DATA: Abdominal and upper back pain.  Question renal
stone.

CT ABDOMEN AND PELVIS WITHOUT CONTRAST
TECHNIQUE: Multidetector CT imaging of the abdomen and pelvis was
performed following the standard protocol without intravenous
contrast.

[Series 2: renal stone < 200 lbs 5.0 b31f · axial · 0.72mm/px · z∈[+867,+1247]mm · 13 of 84 slices shown, 15 images]
[im 4/84  soft-tissue]
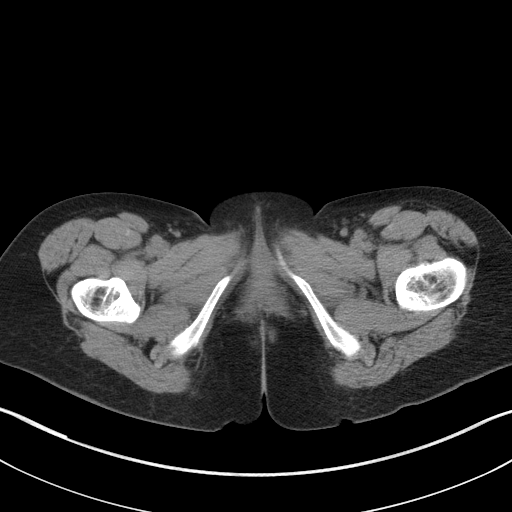
[im 4/84  bone]
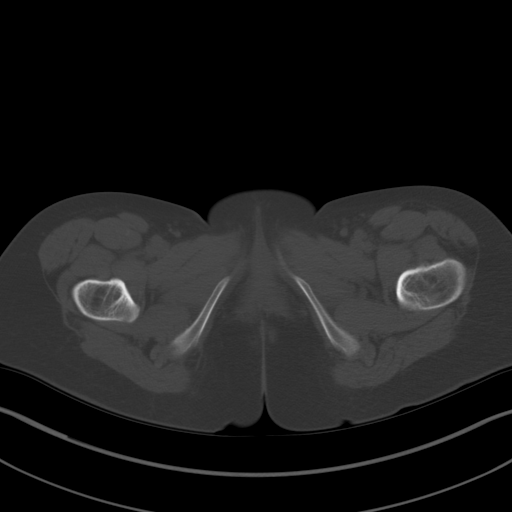
[im 10/84  soft-tissue]
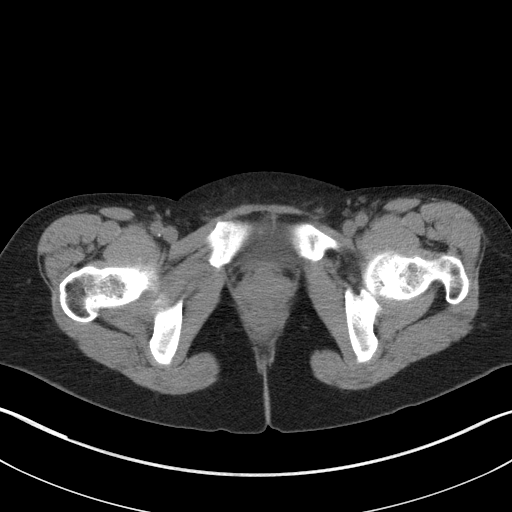
[im 17/84  soft-tissue]
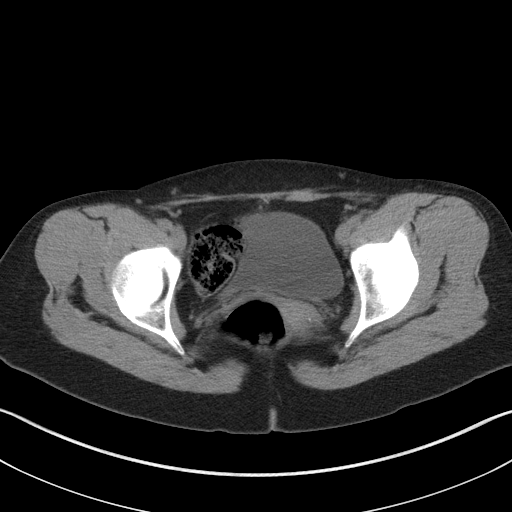
[im 24/84  soft-tissue]
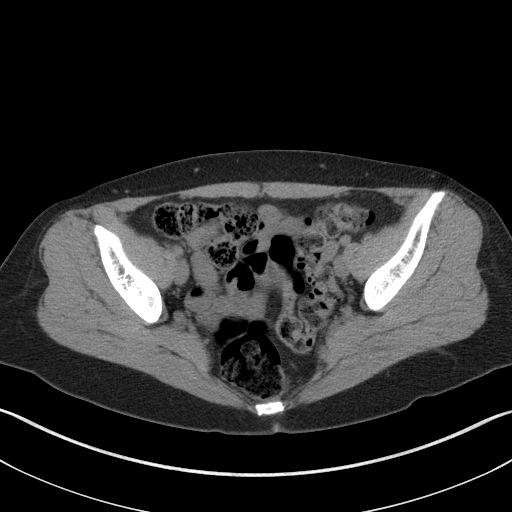
[im 30/84  soft-tissue]
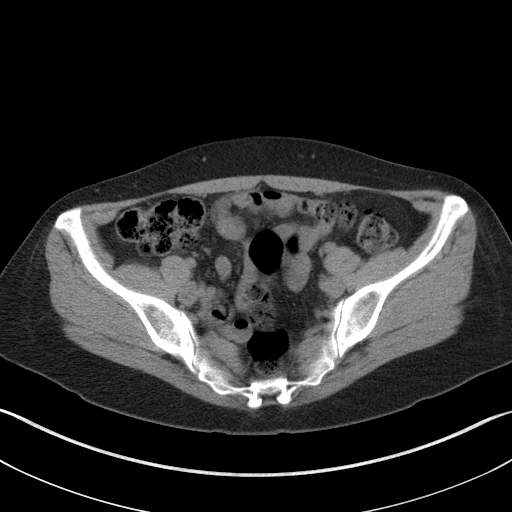
[im 37/84  soft-tissue]
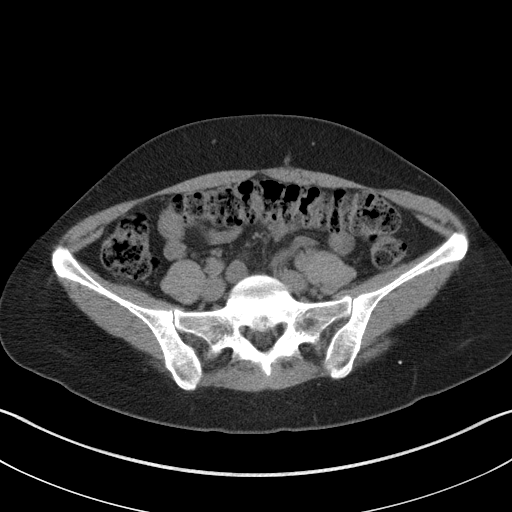
[im 44/84  soft-tissue]
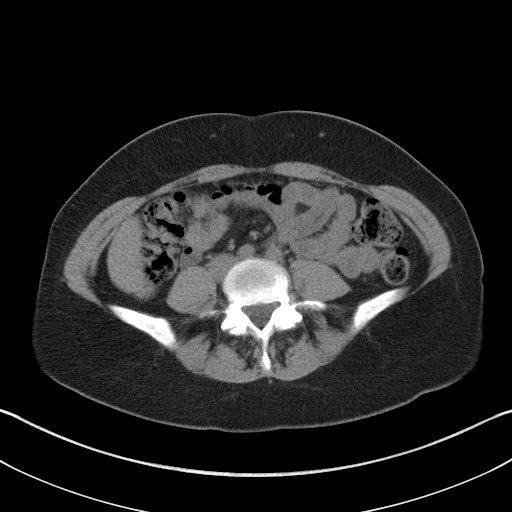
[im 47/84  soft-tissue]
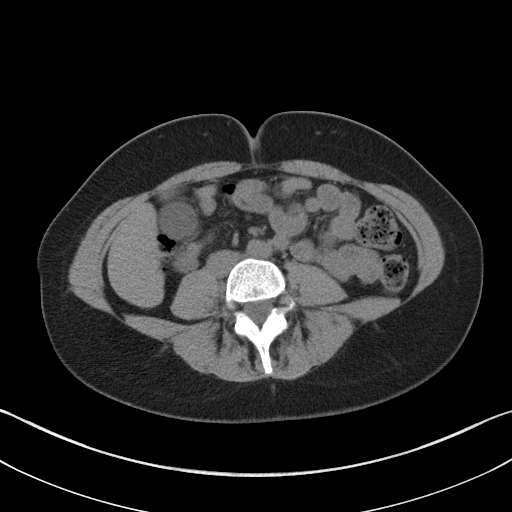
[im 54/84  soft-tissue]
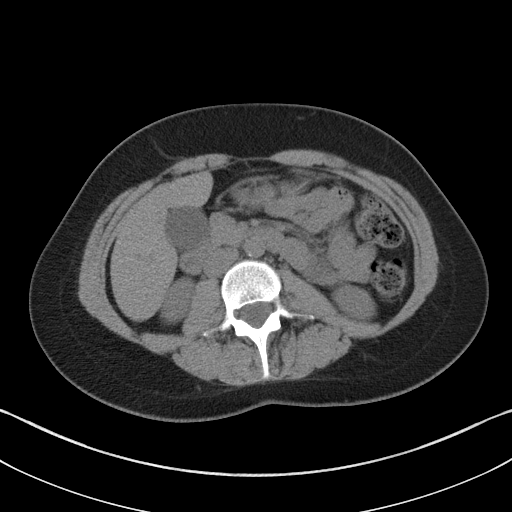
[im 54/84  bone]
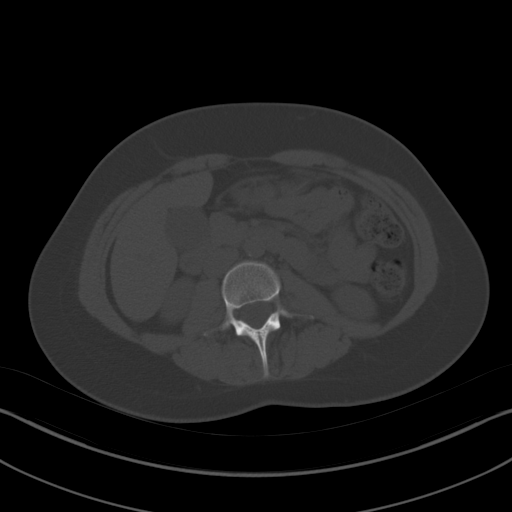
[im 60/84  soft-tissue]
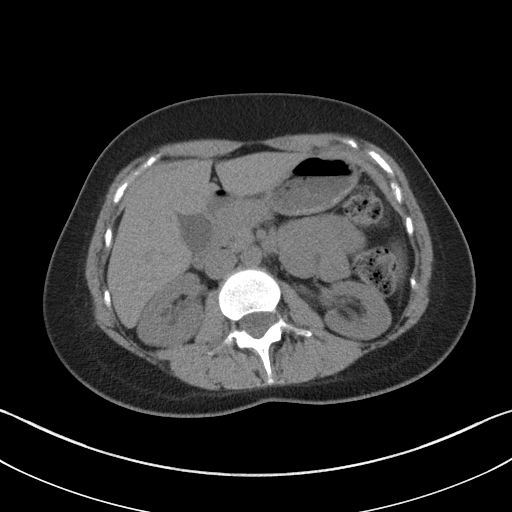
[im 67/84  soft-tissue]
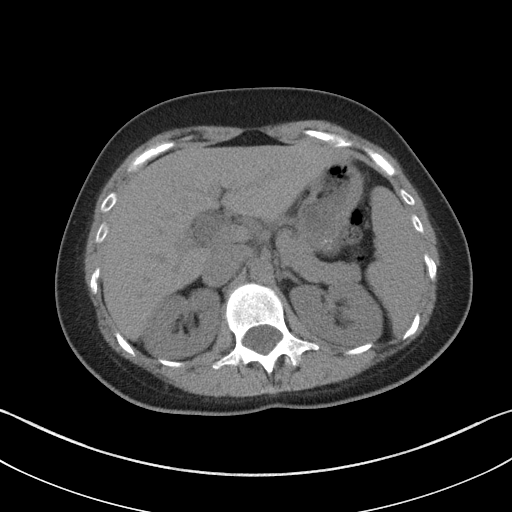
[im 74/84  soft-tissue]
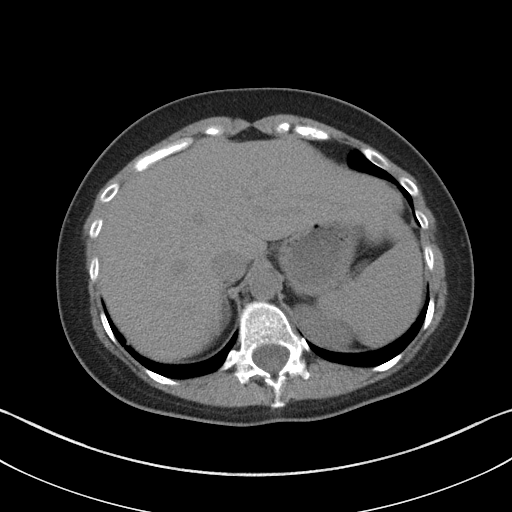
[im 80/84  soft-tissue]
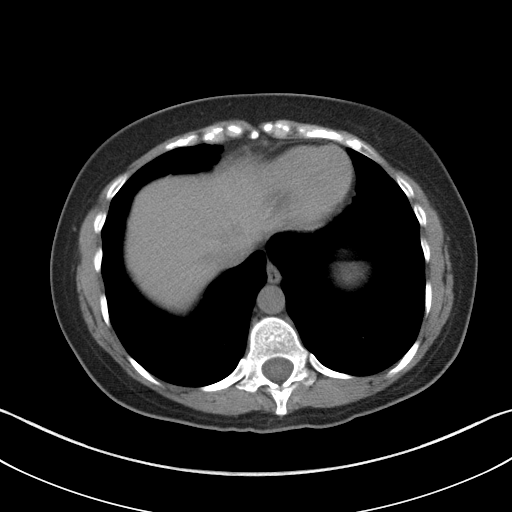

[Series 5: renal stone 3.0 coronal · coronal · 0.77mm/px · 3 of 71 slices shown]
[im 24/71  soft-tissue]
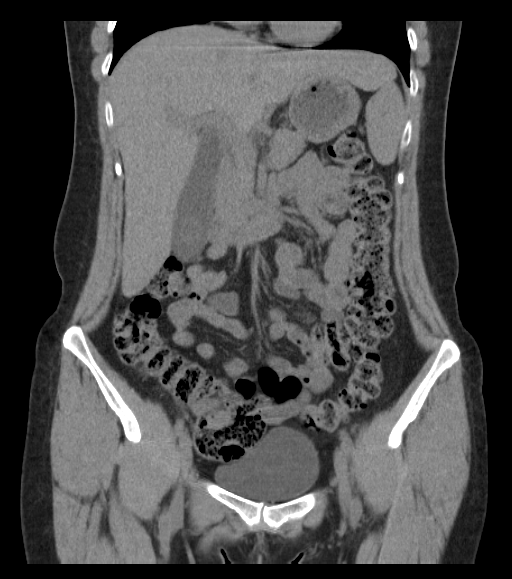
[im 32/71  soft-tissue]
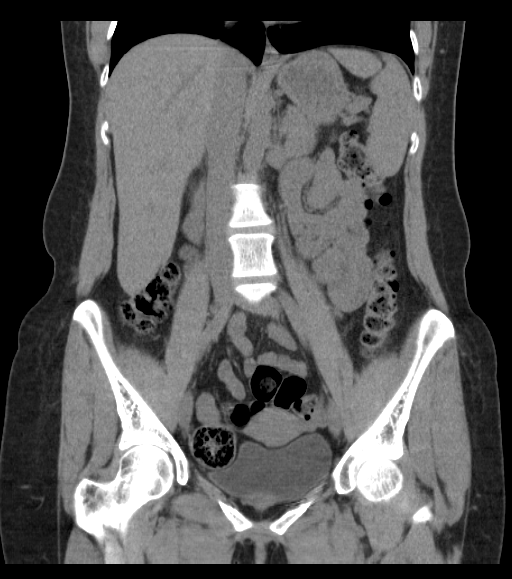
[im 39/71  soft-tissue]
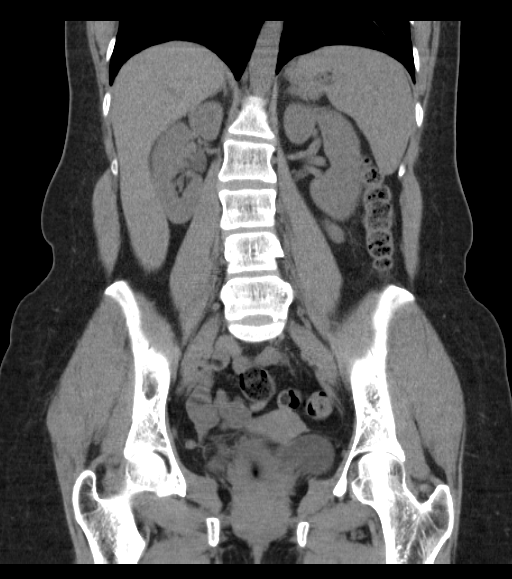

[16 of 46 positions shown; findings below may reference images not displayed]

FINDINGS: Visualized lung bases are clear.

A rounded area of increased density in the gallbladder measures
approximately 2 cm and likely represents a gallstone(s).  No
gallbladder wall thickening or pericholecystic inflammatory change
identified by CT.  No biliary ductal dilatation.

Negative for renal calculi.  Kidneys normal size and parenchymal
thickness.  There is no hydronephrosis or perinephric fluid.

Both ureters are normal in caliber.  No ureteral stone is
identified.

The unfused appearance of the liver spleen adrenal glands and
pancreas is within normal.

The stomach, small bowel loops, and colon are unremarkable.  No
bowel obstruction.  Negative for lymphadenopathy, ascites, free
air. Abdominal aorta normal in caliber and contains scattered small
foci of atherosclerotic calcification.

Visualized vertebral bodies are normal in height and alignment.  No
destructive osseous lesion.
IMPRESSION: 1.  Probable cholelithiasis. No gallbladder inflammatory changes
identified by CT.
2.  Negative for urinary tract stone disease or obstruction.

CT PELVIS
FINDINGS: Distal ureters normal in caliber.  No ureteral or
urinary bladder stone is identified.  Urinary bladder has normal
appearances.  The uterus and adnexa are unremarkable.  The cecum is
positioned in the lower aspect of the right pelvis.  The appendix
is visualized and contains a small appendicolith centrally.  The
base of the appendix measures 9 mm.  The remainder of the appendix
is normal in caliber and the distal tip of the appendix is 3-4 mm.
No peri-appendiceal fat stranding is identified.  No
lymphadenopathy, free fluid, or abscess.  Bony pelvis is intact.
IMPRESSION: 1.  The appendix is identified and contains a tiny appendicolith.
Appendiceal base upper normal to mildly prominent in size but
distal appendix is normal in caliber.  There are no periappendiceal
inflammatory changes. No definite findings to suggest acute
appendicitis, although early inflammatory changes are difficult to
exclude.  Suggest clinical correlation.
2.  Negative for urinary tract stone disease.

## 2012-02-06 ENCOUNTER — Telehealth: Payer: Self-pay | Admitting: Internal Medicine

## 2012-02-06 NOTE — Telephone Encounter (Signed)
Caller: [place]/Patient; Patient Name: Alexandra Swanson; PCP: Sanda Linger (Adults only); Best Callback Phone Number: [phone]  onset 02-04-12 of slight cough and some head congestion.  02-06-12 cough has worsened,  mostly dry but occasionally productive yellow sputum.  No fever but states she feels generally bad.  All emergent sxs per Cough protocols r/o  except for productive cough with colored sputum.  Home care advice given   Appt made for 10-2 at 0815 with Dr Yetta Barre.

## 2012-02-07 ENCOUNTER — Ambulatory Visit: Payer: No Typology Code available for payment source | Admitting: Internal Medicine

## 2012-03-28 ENCOUNTER — Telehealth: Payer: Self-pay | Admitting: Internal Medicine

## 2012-03-28 NOTE — Telephone Encounter (Signed)
Patient is calling concerning refill of her daily medication and to schedule an appointment if needed for refill.  EPIC reviewed . LOV 04/14/2011.  Advised caller that I would forward medication refill request. Transferred caller to the appointment line to schedule an appointment. PLEASE REFILL MEDICATION UNTIL PATIENT APPOINTMENT IF NEEDED.  Patient is request refill on Xanax as well.

## 2012-03-29 NOTE — Telephone Encounter (Signed)
Please specify which med, other than xanax, needs a refill

## 2012-03-29 NOTE — Telephone Encounter (Signed)
Pt advised of same via personal VM and requested to call back with name of Rx or have pharmacy request refill

## 2012-04-08 ENCOUNTER — Encounter: Payer: No Typology Code available for payment source | Admitting: Internal Medicine

## 2012-04-19 ENCOUNTER — Other Ambulatory Visit: Payer: Self-pay | Admitting: Internal Medicine

## 2012-05-02 ENCOUNTER — Other Ambulatory Visit: Payer: Self-pay | Admitting: Internal Medicine

## 2012-05-06 ENCOUNTER — Encounter: Payer: Self-pay | Admitting: Internal Medicine

## 2012-05-06 ENCOUNTER — Ambulatory Visit (INDEPENDENT_AMBULATORY_CARE_PROVIDER_SITE_OTHER): Payer: No Typology Code available for payment source | Admitting: Internal Medicine

## 2012-05-06 ENCOUNTER — Other Ambulatory Visit (INDEPENDENT_AMBULATORY_CARE_PROVIDER_SITE_OTHER): Payer: No Typology Code available for payment source

## 2012-05-06 VITALS — BP 126/82 | HR 70 | Temp 97.5°F | Resp 12 | Ht 67.0 in | Wt 124.1 lb

## 2012-05-06 DIAGNOSIS — I10 Essential (primary) hypertension: Secondary | ICD-10-CM

## 2012-05-06 DIAGNOSIS — Z0000 Encounter for general adult medical examination without abnormal findings: Secondary | ICD-10-CM

## 2012-05-06 DIAGNOSIS — J3089 Other allergic rhinitis: Secondary | ICD-10-CM

## 2012-05-06 DIAGNOSIS — F411 Generalized anxiety disorder: Secondary | ICD-10-CM

## 2012-05-06 LAB — URINALYSIS, ROUTINE W REFLEX MICROSCOPIC
Nitrite: NEGATIVE
Specific Gravity, Urine: >=1.03 (ref 1.000–1.030)
Total Protein, Urine: NEGATIVE
Urobilinogen, UA: 0.2 (ref 0.0–1.0)

## 2012-05-06 LAB — COMPREHENSIVE METABOLIC PANEL
AST: 20 U/L (ref 0–37)
Alkaline Phosphatase: 30 U/L — ABNORMAL LOW (ref 39–117)
Glucose, Bld: 82 mg/dL (ref 70–99)
Sodium: 138 mEq/L (ref 135–145)
Total Bilirubin: 0.6 mg/dL (ref 0.3–1.2)
Total Protein: 7.2 g/dL (ref 6.0–8.3)

## 2012-05-06 LAB — LIPID PANEL
HDL: 51.4 mg/dL (ref >39.00)
LDL Cholesterol: 93 mg/dL (ref 0–99)
Total CHOL/HDL Ratio: 3
VLDL: 22.6 mg/dL (ref 0.0–40.0)

## 2012-05-06 MED ORDER — ALPRAZOLAM 0.25 MG PO TABS: 0.2500 mg | ORAL_TABLET | Freq: Two times a day (BID) | ORAL | Status: DC | PRN

## 2012-05-06 MED ORDER — FLUTICASONE PROPIONATE 50 MCG/ACT NA SUSP: 2.0000 | Freq: Every day | NASAL | Status: DC

## 2012-05-06 NOTE — Assessment & Plan Note (Signed)
Exam done Vaccines were reviewed Labs ordered Pt ed material was given 

## 2012-05-06 NOTE — Progress Notes (Signed)
  Subjective:    Patient ID: Alexandra Swanson, female    DOB: November 06, 1965, 46 y.o.   MRN: [id number]  Hypertension This is a chronic problem. The current episode started more than 1 year ago. The problem has been gradually improving since onset. The problem is controlled. Associated symptoms include anxiety. Pertinent negatives include no blurred vision, chest pain, headaches, malaise/fatigue, neck pain, orthopnea, palpitations, peripheral edema, PND, shortness of breath or sweats. Past treatments include angiotensin blockers. The current treatment provides significant improvement. There are no compliance problems.       Review of Systems  Constitutional: Negative.  Negative for malaise/fatigue.  HENT: Negative.  Negative for neck pain.   Eyes: Negative.  Negative for blurred vision.  Respiratory: Negative for apnea, cough, choking, chest tightness, shortness of breath and stridor.   Cardiovascular: Negative for chest pain, palpitations, orthopnea, leg swelling and PND.  Gastrointestinal: Negative.   Genitourinary: Negative.   Musculoskeletal: Negative.   Skin: Negative.   Neurological: Negative.  Negative for headaches.  Hematological: Negative for adenopathy. Does not bruise/bleed easily.  Psychiatric/Behavioral: Negative for suicidal ideas, hallucinations, behavioral problems, confusion, sleep disturbance, self-injury, dysphoric mood, decreased concentration and agitation. The patient is nervous/anxious. The patient is not hyperactive.        Objective:   Physical Exam  Vitals reviewed. Constitutional: She is oriented to person, place, and time. She appears well-developed and well-nourished. No distress.  HENT:  Head: Normocephalic and atraumatic.  Mouth/Throat: Oropharynx is clear and moist. No oropharyngeal exudate.  Eyes: Conjunctivae normal are normal. Right eye exhibits no discharge. Left eye exhibits no discharge. No scleral icterus.  Neck: Normal range of motion. Neck supple.  No JVD present. No tracheal deviation present. No thyromegaly present.  Cardiovascular: Normal rate, regular rhythm, normal heart sounds and intact distal pulses.  Exam reveals no gallop and no friction rub.   No murmur heard. Pulmonary/Chest: Effort normal and breath sounds normal. No stridor. No respiratory distress. She has no wheezes. She has no rales. She exhibits no tenderness.  Abdominal: Soft. Bowel sounds are normal. She exhibits no distension and no mass. There is no tenderness. There is no rebound and no guarding.  Musculoskeletal: Normal range of motion. She exhibits no edema and no tenderness.  Lymphadenopathy:    She has no cervical adenopathy.  Neurological: She is oriented to person, place, and time.  Skin: Skin is warm and dry. No rash noted. She is not diaphoretic. No erythema. No pallor.  Psychiatric: She has a normal mood and affect. Her behavior is normal. Judgment and thought content normal.     Lab Results  Component Value Date   WBC 7.6 03/04/2010   HGB 13.7 03/04/2010   HCT 40.0 03/04/2010   PLT 202.0 03/04/2010   GLUCOSE 131* 04/14/2011   CHOL 178 03/04/2010   TRIG 118.0 03/04/2010   HDL 59.60 03/04/2010   LDLCALC 95 03/04/2010   ALT 16 04/14/2011   AST 18 04/14/2011   NA 140 04/14/2011   K 4.0 04/14/2011   CL 103 04/14/2011   CREATININE 0.7 04/14/2011   BUN 11 04/14/2011   CO2 27 04/14/2011   TSH 1.66 04/14/2011       Assessment & Plan:

## 2012-05-06 NOTE — Patient Instructions (Signed)
Preventive Care for Adults, Female A healthy lifestyle and preventive care can promote health and wellness. Preventive health guidelines for women include the following key practices.  A routine yearly physical is a good way to check with your caregiver about your health and preventive screening. It is a chance to share any concerns and updates on your health, and to receive a thorough exam.  Visit your dentist for a routine exam and preventive care every 6 months. Brush your teeth twice a day and floss once a day. Good oral hygiene prevents tooth decay and gum disease.  The frequency of eye exams is based on your age, health, family medical history, use of contact lenses, and other factors. Follow your caregiver's recommendations for frequency of eye exams.  Eat a healthy diet. Foods like vegetables, fruits, whole grains, low-fat dairy products, and lean protein foods contain the nutrients you need without too many calories. Decrease your intake of foods high in solid fats, added sugars, and salt. Eat the right amount of calories for you.Get information about a proper diet from your caregiver, if necessary.  Regular physical exercise is one of the most important things you can do for your health. Most adults should get at least 150 minutes of moderate-intensity exercise (any activity that increases your heart rate and causes you to sweat) each week. In addition, most adults need muscle-strengthening exercises on 2 or more days a week.  Maintain a healthy weight. The body mass index (BMI) is a screening tool to identify possible weight problems. It provides an estimate of body fat based on height and weight. Your caregiver can help determine your BMI, and can help you achieve or maintain a healthy weight.For adults 20 years and older:  A BMI below 18.5 is considered underweight.  A BMI of 18.5 to 24.9 is normal.  A BMI of 25 to 29.9 is considered overweight.  A BMI of 30 and above is  considered obese.  Maintain normal blood lipids and cholesterol levels by exercising and minimizing your intake of saturated fat. Eat a balanced diet with plenty of fruit and vegetables. Blood tests for lipids and cholesterol should begin at age 20 and be repeated every 5 years. If your lipid or cholesterol levels are high, you are over 50, or you are at high risk for heart disease, you may need your cholesterol levels checked more frequently.Ongoing high lipid and cholesterol levels should be treated with medicines if diet and exercise are not effective.  If you smoke, find out from your caregiver how to quit. If you do not use tobacco, do not start.  If you are pregnant, do not drink alcohol. If you are breastfeeding, be very cautious about drinking alcohol. If you are not pregnant and choose to drink alcohol, do not exceed 1 drink per day. One drink is considered to be 12 ounces (355 mL) of beer, 5 ounces (148 mL) of wine, or 1.5 ounces (44 mL) of liquor.  Avoid use of street drugs. Do not share needles with anyone. Ask for help if you need support or instructions about stopping the use of drugs.  High blood pressure causes heart disease and increases the risk of stroke. Your blood pressure should be checked at least every 1 to 2 years. Ongoing high blood pressure should be treated with medicines if weight loss and exercise are not effective.  If you are 55 to 45 years old, ask your caregiver if you should take aspirin to prevent strokes.  Diabetes   screening involves taking a blood sample to check your fasting blood sugar level. This should be done once every 3 years, after age 45, if you are within normal weight and without risk factors for diabetes. Testing should be considered at a younger age or be carried out more frequently if you are overweight and have at least 1 risk factor for diabetes.  Breast cancer screening is essential preventive care for women. You should practice "breast  self-awareness." This means understanding the normal appearance and feel of your breasts and may include breast self-examination. Any changes detected, no matter how small, should be reported to a caregiver. Women in their 20s and 30s should have a clinical breast exam (CBE) by a caregiver as part of a regular health exam every 1 to 3 years. After age 40, women should have a CBE every year. Starting at age 40, women should consider having a mammography (breast X-ray test) every year. Women who have a family history of breast cancer should talk to their caregiver about genetic screening. Women at a high risk of breast cancer should talk to their caregivers about having magnetic resonance imaging (MRI) and a mammography every year.  The Pap test is a screening test for cervical cancer. A Pap test can show cell changes on the cervix that might become cervical cancer if left untreated. A Pap test is a procedure in which cells are obtained and examined from the lower end of the uterus (cervix).  Women should have a Pap test starting at age 21.  Between ages 21 and 29, Pap tests should be repeated every 2 years.  Beginning at age 30, you should have a Pap test every 3 years as long as the past 3 Pap tests have been normal.  Some women have medical problems that increase the chance of getting cervical cancer. Talk to your caregiver about these problems. It is especially important to talk to your caregiver if a new problem develops soon after your last Pap test. In these cases, your caregiver may recommend more frequent screening and Pap tests.  The above recommendations are the same for women who have or have not gotten the vaccine for human papillomavirus (HPV).  If you had a hysterectomy for a problem that was not cancer or a condition that could lead to cancer, then you no longer need Pap tests. Even if you no longer need a Pap test, a regular exam is a good idea to make sure no other problems are  starting.  If you are between ages 65 and 70, and you have had normal Pap tests going back 10 years, you no longer need Pap tests. Even if you no longer need a Pap test, a regular exam is a good idea to make sure no other problems are starting.  If you have had past treatment for cervical cancer or a condition that could lead to cancer, you need Pap tests and screening for cancer for at least 20 years after your treatment.  If Pap tests have been discontinued, risk factors (such as a new sexual partner) need to be reassessed to determine if screening should be resumed.  The HPV test is an additional test that may be used for cervical cancer screening. The HPV test looks for the virus that can cause the cell changes on the cervix. The cells collected during the Pap test can be tested for HPV. The HPV test could be used to screen women aged 30 years and older, and should   be used in women of any age who have unclear Pap test results. After the age of 30, women should have HPV testing at the same frequency as a Pap test.  Colorectal cancer can be detected and often prevented. Most routine colorectal cancer screening begins at the age of 50 and continues through age 75. However, your caregiver may recommend screening at an earlier age if you have risk factors for colon cancer. On a yearly basis, your caregiver may provide home test kits to check for hidden blood in the stool. Use of a small camera at the end of a tube, to directly examine the colon (sigmoidoscopy or colonoscopy), can detect the earliest forms of colorectal cancer. Talk to your caregiver about this at age 50, when routine screening begins. Direct examination of the colon should be repeated every 5 to 10 years through age 75, unless early forms of pre-cancerous polyps or small growths are found.  Hepatitis C blood testing is recommended for all people born from 1945 through 1965 and any individual with known risks for hepatitis C.  Practice  safe sex. Use condoms and avoid high-risk sexual practices to reduce the spread of sexually transmitted infections (STIs). STIs include gonorrhea, chlamydia, syphilis, trichomonas, herpes, HPV, and human immunodeficiency virus (HIV). Herpes, HIV, and HPV are viral illnesses that have no cure. They can result in disability, cancer, and death. Sexually active women aged 25 and younger should be checked for chlamydia. Older women with new or multiple partners should also be tested for chlamydia. Testing for other STIs is recommended if you are sexually active and at increased risk.  Osteoporosis is a disease in which the bones lose minerals and strength with aging. This can result in serious bone fractures. The risk of osteoporosis can be identified using a bone density scan. Women ages 65 and over and women at risk for fractures or osteoporosis should discuss screening with their caregivers. Ask your caregiver whether you should take a calcium supplement or vitamin D to reduce the rate of osteoporosis.  Menopause can be associated with physical symptoms and risks. Hormone replacement therapy is available to decrease symptoms and risks. You should talk to your caregiver about whether hormone replacement therapy is right for you.  Use sunscreen with sun protection factor (SPF) of 30 or more. Apply sunscreen liberally and repeatedly throughout the day. You should seek shade when your shadow is shorter than you. Protect yourself by wearing long sleeves, pants, a wide-brimmed hat, and sunglasses year round, whenever you are outdoors.  Once a month, do a whole body skin exam, using a mirror to look at the skin on your back. Notify your caregiver of new moles, moles that have irregular borders, moles that are larger than a pencil eraser, or moles that have changed in shape or color.  Stay current with required immunizations.  Influenza. You need a dose every fall (or winter). The composition of the flu vaccine  changes each year, so being vaccinated once is not enough.  Pneumococcal polysaccharide. You need 1 to 2 doses if you smoke cigarettes or if you have certain chronic medical conditions. You need 1 dose at age 65 (or older) if you have never been vaccinated.  Tetanus, diphtheria, pertussis (Tdap, Td). Get 1 dose of Tdap vaccine if you are younger than age 65, are over 65 and have contact with an infant, are a healthcare worker, are pregnant, or simply want to be protected from whooping cough. After that, you need a Td   booster dose every 10 years. Consult your caregiver if you have not had at least 3 tetanus and diphtheria-containing shots sometime in your life or have a deep or dirty wound.  HPV. You need this vaccine if you are a woman age 26 or younger. The vaccine is given in 3 doses over 6 months.  Measles, mumps, rubella (MMR). You need at least 1 dose of MMR if you were born in 1957 or later. You may also need a second dose.  Meningococcal. If you are age 19 to 21 and a first-year college student living in a residence hall, or have one of several medical conditions, you need to get vaccinated against meningococcal disease. You may also need additional booster doses.  Zoster (shingles). If you are age 60 or older, you should get this vaccine.  Varicella (chickenpox). If you have never had chickenpox or you were vaccinated but received only 1 dose, talk to your caregiver to find out if you need this vaccine.  Hepatitis A. You need this vaccine if you have a specific risk factor for hepatitis A virus infection or you simply wish to be protected from this disease. The vaccine is usually given as 2 doses, 6 to 18 months apart.  Hepatitis B. You need this vaccine if you have a specific risk factor for hepatitis B virus infection or you simply wish to be protected from this disease. The vaccine is given in 3 doses, usually over 6 months. Preventive Services / Frequency Ages 19 to 39  Blood  pressure check.** / Every 1 to 2 years.  Lipid and cholesterol check.** / Every 5 years beginning at age 20.  Clinical breast exam.** / Every 3 years for women in their 20s and 30s.  Pap test.** / Every 2 years from ages 21 through 29. Every 3 years starting at age 30 through age 65 or 70 with a history of 3 consecutive normal Pap tests.  HPV screening.** / Every 3 years from ages 30 through ages 65 to 70 with a history of 3 consecutive normal Pap tests.  Hepatitis C blood test.** / For any individual with known risks for hepatitis C.  Skin self-exam. / Monthly.  Influenza immunization.** / Every year.  Pneumococcal polysaccharide immunization.** / 1 to 2 doses if you smoke cigarettes or if you have certain chronic medical conditions.  Tetanus, diphtheria, pertussis (Tdap, Td) immunization. / A one-time dose of Tdap vaccine. After that, you need a Td booster dose every 10 years.  HPV immunization. / 3 doses over 6 months, if you are 26 and younger.  Measles, mumps, rubella (MMR) immunization. / You need at least 1 dose of MMR if you were born in 1957 or later. You may also need a second dose.  Meningococcal immunization. / 1 dose if you are age 19 to 21 and a first-year college student living in a residence hall, or have one of several medical conditions, you need to get vaccinated against meningococcal disease. You may also need additional booster doses.  Varicella immunization.** / Consult your caregiver.  Hepatitis A immunization.** / Consult your caregiver. 2 doses, 6 to 18 months apart.  Hepatitis B immunization.** / Consult your caregiver. 3 doses usually over 6 months. Ages 40 to 64  Blood pressure check.** / Every 1 to 2 years.  Lipid and cholesterol check.** / Every 5 years beginning at age 20.  Clinical breast exam.** / Every year after age 40.  Mammogram.** / Every year beginning at age 40   and continuing for as long as you are in good health. Consult with your  caregiver.  Pap test.** / Every 3 years starting at age 30 through age 65 or 70 with a history of 3 consecutive normal Pap tests.  HPV screening.** / Every 3 years from ages 30 through ages 65 to 70 with a history of 3 consecutive normal Pap tests.  Fecal occult blood test (FOBT) of stool. / Every year beginning at age 50 and continuing until age 75. You may not need to do this test if you get a colonoscopy every 10 years.  Flexible sigmoidoscopy or colonoscopy.** / Every 5 years for a flexible sigmoidoscopy or every 10 years for a colonoscopy beginning at age 50 and continuing until age 75.  Hepatitis C blood test.** / For all people born from 1945 through 1965 and any individual with known risks for hepatitis C.  Skin self-exam. / Monthly.  Influenza immunization.** / Every year.  Pneumococcal polysaccharide immunization.** / 1 to 2 doses if you smoke cigarettes or if you have certain chronic medical conditions.  Tetanus, diphtheria, pertussis (Tdap, Td) immunization.** / A one-time dose of Tdap vaccine. After that, you need a Td booster dose every 10 years.  Measles, mumps, rubella (MMR) immunization. / You need at least 1 dose of MMR if you were born in 1957 or later. You may also need a second dose.  Varicella immunization.** / Consult your caregiver.  Meningococcal immunization.** / Consult your caregiver.  Hepatitis A immunization.** / Consult your caregiver. 2 doses, 6 to 18 months apart.  Hepatitis B immunization.** / Consult your caregiver. 3 doses, usually over 6 months. Ages 65 and over  Blood pressure check.** / Every 1 to 2 years.  Lipid and cholesterol check.** / Every 5 years beginning at age 20.  Clinical breast exam.** / Every year after age 40.  Mammogram.** / Every year beginning at age 40 and continuing for as long as you are in good health. Consult with your caregiver.  Pap test.** / Every 3 years starting at age 30 through age 65 or 70 with a 3  consecutive normal Pap tests. Testing can be stopped between 65 and 70 with 3 consecutive normal Pap tests and no abnormal Pap or HPV tests in the past 10 years.  HPV screening.** / Every 3 years from ages 30 through ages 65 or 70 with a history of 3 consecutive normal Pap tests. Testing can be stopped between 65 and 70 with 3 consecutive normal Pap tests and no abnormal Pap or HPV tests in the past 10 years.  Fecal occult blood test (FOBT) of stool. / Every year beginning at age 50 and continuing until age 75. You may not need to do this test if you get a colonoscopy every 10 years.  Flexible sigmoidoscopy or colonoscopy.** / Every 5 years for a flexible sigmoidoscopy or every 10 years for a colonoscopy beginning at age 50 and continuing until age 75.  Hepatitis C blood test.** / For all people born from 1945 through 1965 and any individual with known risks for hepatitis C.  Osteoporosis screening.** / A one-time screening for women ages 65 and over and women at risk for fractures or osteoporosis.  Skin self-exam. / Monthly.  Influenza immunization.** / Every year.  Pneumococcal polysaccharide immunization.** / 1 dose at age 65 (or older) if you have never been vaccinated.  Tetanus, diphtheria, pertussis (Tdap, Td) immunization. / A one-time dose of Tdap vaccine if you are over   65 and have contact with an infant, are a healthcare worker, or simply want to be protected from whooping cough. After that, you need a Td booster dose every 10 years.  Varicella immunization.** / Consult your caregiver.  Meningococcal immunization.** / Consult your caregiver.  Hepatitis A immunization.** / Consult your caregiver. 2 doses, 6 to 18 months apart.  Hepatitis B immunization.** / Check with your caregiver. 3 doses, usually over 6 months. ** Family history and personal history of risk and conditions may change your caregiver's recommendations. Document Released: 06/20/2001 Document Revised: 07/17/2011  Document Reviewed: 09/19/2010 [employer] Patient Information 2013 [employer].  

## 2012-05-07 ENCOUNTER — Encounter: Payer: Self-pay | Admitting: Internal Medicine

## 2012-05-07 LAB — CBC WITH DIFFERENTIAL/PLATELET
Basophils Relative: 0.2 % (ref 0.0–3.0)
Eosinophils Relative: 0.6 % (ref 0.0–5.0)
HCT: 41.1 % (ref 36.0–46.0)
Lymphs Abs: 2.1 10*3/uL (ref 0.7–4.0)
MCHC: 33.6 g/dL (ref 30.0–36.0)
MCV: 89.1 fl (ref 78.0–100.0)
Monocytes Absolute: 0.1 10*3/uL (ref 0.1–1.0)
Platelets: 207 10*3/uL (ref 150.0–400.0)
WBC: 8.6 10*3/uL (ref 4.5–10.5)

## 2012-07-02 ENCOUNTER — Telehealth: Payer: Self-pay

## 2012-07-02 NOTE — Telephone Encounter (Signed)
I have been out of the office since 06/26/12- today. Received rejection from pharmacy and obtained the form. PA form received and

## 2012-07-02 NOTE — Telephone Encounter (Signed)
Pt called requesting status of Benicar PA. Pt says paperwork was faxed to office from pharmacy late last week.

## 2012-07-04 ENCOUNTER — Telehealth: Payer: Self-pay | Admitting: *Deleted

## 2012-07-04 NOTE — Telephone Encounter (Signed)
Per Epic BENICAR 20 MG tablet was filled # 30 tablet w/ refills on 05/02/2012. Insurance will not cover without a PA.  PA was initiated and faxed back, it is now up to her insurance to approve

## 2012-07-04 NOTE — Telephone Encounter (Signed)
HEY, PATIENT CALLED AGAIN THIS AM ON STATUS OF HER MEDICATION REFILL FOR BENICAR. STATES HER INSURANCE RUNS OUT TOMORROW. UPSET THAT THIS HAS NOT BEEN SEEN TO. RETURN CALL # [phone]

## 2012-07-04 NOTE — Telephone Encounter (Signed)
PATIENT NOTIFIED OF STATUS OF Rx SENT TO PHARMACY AND PA WAS SENT IN. PATIENT STILL REMAINS UPSET THAT THIS HAS TOOK SO LONG TO ACCOMPLISH. APOLOGIZED TO PATIENT FOR THIS.

## 2012-07-09 ENCOUNTER — Telehealth: Payer: Self-pay

## 2012-07-09 DIAGNOSIS — I10 Essential (primary) hypertension: Secondary | ICD-10-CM

## 2012-07-09 MED ORDER — LOSARTAN POTASSIUM 100 MG PO TABS: 100.0000 mg | ORAL_TABLET | Freq: Every day | ORAL | Status: DC

## 2012-07-09 NOTE — Telephone Encounter (Signed)
Received fax from [facility] stating benicar denied. Must try and fail at least two alternative which include; losartan, irbesartan,candesartan, etc.

## 2012-07-09 NOTE — Telephone Encounter (Signed)
Pt notified but states insurance has lapsed until April. Pt has samples until then

## 2012-07-09 NOTE — Telephone Encounter (Signed)
Start losartan

## 2012-08-22 ENCOUNTER — Telehealth: Payer: Self-pay | Admitting: Internal Medicine

## 2012-08-22 NOTE — Telephone Encounter (Signed)
Patient Information:  Caller Name: Masiel  Phone: [phone]  Patient: Alexandra Swanson, Alexandra Swanson  Gender: Female  DOB: 07-26-1965  Age: 47 Years  PCP: Sanda Linger (Adults only)  Pregnant: No  Office Follow Up:  Does the office need to follow up with this patient?: No  Instructions For The Office: N/A  RN Note:  Onset was 08/18/12 with scratchy throat that bloomed into congestion and pressure in face/sinus at pain level of 6/10 pain scale.  Drainage is clear, is not sure of a fever-but denies all other emergent symptoms.  Triaged with care advice given.  Caller demonstrated her understanding and will call back as needed.  Symptoms  Reason For Call & Symptoms: Face pressure and congestion- nasal  Reviewed Health History In EMR: Yes  Reviewed Medications In EMR: Yes  Reviewed Allergies In EMR: Yes  Reviewed Surgeries / Procedures: Yes  Date of Onset of Symptoms: 08/18/2012  Treatments Tried: Mucinex  Treatments Tried Worked: Yes OB / GYN:  LMP: Unknown  Guideline(s) Used:  Colds  Disposition Per Guideline:   Home Care  Reason For Disposition Reached:   Colds with no complications  Advice Given:  For a Runny Nose With Profuse Discharge:   Nasal mucus and discharge helps to wash viruses and bacteria out of the nose and sinuses.  For a Stuffy Nose - Use Nasal Washes:  Introduction: Saline (salt water) nasal irrigation (nasal wash) is an effective and simple home remedy for treating stuffy nose and sinus congestion. The nose can be irrigated by pouring, spraying, or squirting salt water into the nose and then letting it run back out.  Humidifier:  If the air in your home is dry, use a cool-mist humidifier  Treatment for Associated Symptoms of Colds:  For muscle aches, headaches, or moderate fever (more than 101 F or 38.9 C): Take acetaminophen every 4 hours.  Sore throat: Try throat lozenges, hard candy, or warm chicken broth.  Cough: Use cough drops.  Hydrate: Drink adequate  liquids.  Humidifier:  If the air in your home is dry, use a cool-mist humidifier  Expected Course:   Fever may last 2-3 days  Nasal discharge 7-14 days  Cough up to 2-3 weeks.  Call Back If:  Difficulty breathing occurs  Fever lasts more than 3 days  Nasal discharge lasts more than 10 days  You become worse  Patient Will Follow Care Advice:  YES

## 2012-09-05 LAB — HM PAP SMEAR: HM Pap smear: NORMAL

## 2012-09-11 LAB — HM MAMMOGRAPHY: HM Mammogram: NORMAL

## 2012-12-05 ENCOUNTER — Telehealth: Payer: Self-pay | Admitting: *Deleted

## 2012-12-05 NOTE — Telephone Encounter (Signed)
Pt called requesting samples of Benicar 20 mg.  Samples at front desk.  Pt aware.

## 2013-01-12 ENCOUNTER — Other Ambulatory Visit: Payer: Self-pay | Admitting: Internal Medicine

## 2013-01-15 ENCOUNTER — Telehealth: Payer: Self-pay | Admitting: Internal Medicine

## 2013-01-15 ENCOUNTER — Other Ambulatory Visit: Payer: Self-pay | Admitting: Internal Medicine

## 2013-01-15 DIAGNOSIS — E785 Hyperlipidemia, unspecified: Secondary | ICD-10-CM

## 2013-01-15 DIAGNOSIS — I10 Essential (primary) hypertension: Secondary | ICD-10-CM

## 2013-01-15 NOTE — Telephone Encounter (Signed)
Patient Information:  Caller Name: Jazzalyn  Phone: [phone]  Patient: Alexandra Swanson, Alexandra Swanson  Gender: Female  DOB: February 10, 1966  Age: 47 Years  PCP: Sanda Linger (Adults only)  Pregnant: No  Office Follow Up:  Does the office need to follow up with this patient?: Yes  Instructions For The Office: Pt is wanting to make an appt with Dr Yetta Barre for palpitations but pt wants to schedule blood work prior to coming in.  Office please follow up with patient. Pt states you can leave a message on her phone if needed  RN Note:  Pt is wanting to get blood work done before seeing Dr Yetta Barre  Symptoms  Reason For Call & Symptoms: pt states that she has been having more muscle cramps and some palpitations.  Pt is worried about her potassium level.  Reviewed Health History In EMR: Yes  Reviewed Medications In EMR: Yes  Reviewed Allergies In EMR: Yes  Reviewed Surgeries / Procedures: Yes  Date of Onset of Symptoms: Unknown  Treatments Tried: pt has tried drinking more water, anxiety meds  Treatments Tried Worked: No OB / GYN:  LMP: Unknown  Guideline(s) Used:  Heart Rate and Heartbeat Questions  Disposition Per Guideline:   See Within 2 Weeks in Office  Reason For Disposition Reached:   Palpitations are a chronic symptom (recurrent or ongoing AND present > 4 weeks)  Advice Given:  N/A  Patient Will Follow Care Advice:  YES

## 2013-01-15 NOTE — Telephone Encounter (Signed)
Patient is aware orders are in. She is also aware of lab hours.

## 2013-01-15 NOTE — Telephone Encounter (Signed)
Labs have been ordered

## 2013-01-16 ENCOUNTER — Other Ambulatory Visit (INDEPENDENT_AMBULATORY_CARE_PROVIDER_SITE_OTHER): Payer: No Typology Code available for payment source

## 2013-01-16 DIAGNOSIS — E785 Hyperlipidemia, unspecified: Secondary | ICD-10-CM

## 2013-01-16 DIAGNOSIS — I10 Essential (primary) hypertension: Secondary | ICD-10-CM

## 2013-01-16 LAB — COMPREHENSIVE METABOLIC PANEL
ALT: 22 U/L (ref 0–35)
Albumin: 3.9 g/dL (ref 3.5–5.2)
Alkaline Phosphatase: 31 U/L — ABNORMAL LOW (ref 39–117)
Glucose, Bld: 79 mg/dL (ref 70–99)
Potassium: 4.8 mEq/L (ref 3.5–5.1)
Sodium: 139 mEq/L (ref 135–145)
Total Bilirubin: 0.6 mg/dL (ref 0.3–1.2)
Total Protein: 7.2 g/dL (ref 6.0–8.3)

## 2013-01-16 LAB — TSH: TSH: 2.69 u[IU]/mL (ref 0.35–5.50)

## 2013-01-16 LAB — CBC WITH DIFFERENTIAL/PLATELET
Basophils Absolute: 0 10*3/uL (ref 0.0–0.1)
Eosinophils Absolute: 0 10*3/uL (ref 0.0–0.7)
HCT: 43.1 % (ref 36.0–46.0)
Lymphs Abs: 2.1 10*3/uL (ref 0.7–4.0)
MCHC: 33.8 g/dL (ref 30.0–36.0)
MCV: 88.7 fl (ref 78.0–100.0)
Monocytes Absolute: 0.5 10*3/uL (ref 0.1–1.0)
Neutrophils Relative %: 66 % (ref 43.0–77.0)
Platelets: 209 10*3/uL (ref 150.0–400.0)
RDW: 13.2 % (ref 11.5–14.6)

## 2013-01-16 LAB — LIPID PANEL
LDL Cholesterol: 104 mg/dL — ABNORMAL HIGH (ref 0–99)
VLDL: 17.4 mg/dL (ref 0.0–40.0)

## 2013-01-17 ENCOUNTER — Encounter: Payer: Self-pay | Admitting: Internal Medicine

## 2013-04-10 ENCOUNTER — Telehealth: Payer: Self-pay | Admitting: *Deleted

## 2013-04-10 NOTE — Telephone Encounter (Signed)
I have not seen her in a whole year

## 2013-04-10 NOTE — Telephone Encounter (Signed)
Pt called requesting Benicar refill, medication not on current med list.  Please advise

## 2013-04-11 NOTE — Telephone Encounter (Signed)
Pt has appoint scheduled.

## 2013-04-14 ENCOUNTER — Encounter: Payer: Self-pay | Admitting: Internal Medicine

## 2013-04-14 ENCOUNTER — Ambulatory Visit (INDEPENDENT_AMBULATORY_CARE_PROVIDER_SITE_OTHER): Payer: No Typology Code available for payment source | Admitting: Internal Medicine

## 2013-04-14 VITALS — BP 128/84 | HR 62 | Temp 97.8°F | Resp 16 | Ht 67.0 in | Wt 134.0 lb

## 2013-04-14 DIAGNOSIS — I10 Essential (primary) hypertension: Secondary | ICD-10-CM

## 2013-04-14 MED ORDER — OLMESARTAN MEDOXOMIL 20 MG PO TABS: 20.0000 mg | ORAL_TABLET | Freq: Every day | ORAL | Status: DC

## 2013-04-14 NOTE — Progress Notes (Signed)
   Subjective:    Patient ID: Alexandra Swanson, female    DOB: 08-06-1965, 47 y.o.   MRN: [id number]  Hypertension This is a chronic problem. The current episode started more than 1 year ago. The problem has been gradually improving since onset. The problem is controlled. Associated symptoms include anxiety. Pertinent negatives include no blurred vision, chest pain, headaches, malaise/fatigue, neck pain, orthopnea, palpitations, peripheral edema, PND, shortness of breath or sweats. Past treatments include angiotensin blockers. The current treatment provides moderate improvement. There are no compliance problems.       Review of Systems  Constitutional: Negative for malaise/fatigue.  Eyes: Negative for blurred vision.  Respiratory: Negative for shortness of breath.   Cardiovascular: Negative for chest pain, palpitations, orthopnea and PND.  Musculoskeletal: Negative for neck pain.  Neurological: Negative for headaches.  All other systems reviewed and are negative.       Objective:   Physical Exam  Vitals reviewed. Constitutional: She is oriented to person, place, and time. She appears well-developed and well-nourished. No distress.  HENT:  Head: Normocephalic and atraumatic.  Mouth/Throat: Oropharynx is clear and moist. No oropharyngeal exudate.  Eyes: Conjunctivae are normal. Right eye exhibits no discharge. Left eye exhibits no discharge. No scleral icterus.  Neck: Normal range of motion. Neck supple. No JVD present. No tracheal deviation present. No thyromegaly present.  Cardiovascular: Normal rate, regular rhythm, normal heart sounds and intact distal pulses.  Exam reveals no gallop and no friction rub.   No murmur heard. Pulmonary/Chest: Effort normal and breath sounds normal. No stridor. No respiratory distress. She has no wheezes. She has no rales. She exhibits no tenderness.  Abdominal: Soft. Bowel sounds are normal. She exhibits no distension and no mass. There is no  tenderness. There is no rebound and no guarding.  Musculoskeletal: Normal range of motion. She exhibits no edema and no tenderness.  Lymphadenopathy:    She has no cervical adenopathy.  Neurological: She is oriented to person, place, and time.  Skin: Skin is warm and dry. No rash noted. She is not diaphoretic. No erythema. No pallor.  Psychiatric: She has a normal mood and affect. Her behavior is normal. Judgment and thought content normal.     Lab Results  Component Value Date   WBC 8.1 01/16/2013   HGB 14.6 01/16/2013   HCT 43.1 01/16/2013   PLT 209.0 01/16/2013   GLUCOSE 79 01/16/2013   CHOL 183 01/16/2013   TRIG 87.0 01/16/2013   HDL 61.30 01/16/2013   LDLCALC 104* 01/16/2013   ALT 22 01/16/2013   AST 20 01/16/2013   NA 139 01/16/2013   K 4.8 01/16/2013   CL 106 01/16/2013   CREATININE 0.6 01/16/2013   BUN 12 01/16/2013   CO2 27 01/16/2013   TSH 2.69 01/16/2013       Assessment & Plan:

## 2013-04-14 NOTE — Progress Notes (Signed)
Pre visit review using our clinic review tool, if applicable. No additional management support is needed unless otherwise documented below in the visit note. 

## 2013-04-14 NOTE — Patient Instructions (Signed)

## 2013-04-15 ENCOUNTER — Other Ambulatory Visit: Payer: Self-pay

## 2013-04-15 ENCOUNTER — Telehealth: Payer: Self-pay

## 2013-04-15 ENCOUNTER — Encounter: Payer: Self-pay | Admitting: Internal Medicine

## 2013-04-15 DIAGNOSIS — I10 Essential (primary) hypertension: Secondary | ICD-10-CM

## 2013-04-15 MED ORDER — LOSARTAN POTASSIUM 100 MG PO TABS: 100.0000 mg | ORAL_TABLET | Freq: Every day | ORAL | Status: DC

## 2013-04-15 NOTE — Telephone Encounter (Signed)
Benicar denied per insurance rejection must try irbesartan, lisinopril or losartan. Called pharmacy to ensure that they updated insurance card [facility] and not [employer] due to rejection phone number the same as before. Pharmacy confirmed new insurance on file and that PA must go throught the same compnay. Per MD on 07/09/12 phone note, will resend Rx for Losartan for pt to try and fail.

## 2013-04-15 NOTE — Assessment & Plan Note (Signed)
Her BP is well controlled 

## 2013-04-16 ENCOUNTER — Encounter: Payer: Self-pay | Admitting: Internal Medicine

## 2013-06-03 ENCOUNTER — Other Ambulatory Visit: Payer: Self-pay | Admitting: Internal Medicine

## 2013-08-01 ENCOUNTER — Other Ambulatory Visit: Payer: Self-pay | Admitting: Internal Medicine

## 2013-08-09 ENCOUNTER — Encounter: Payer: Self-pay | Admitting: Internal Medicine

## 2013-08-12 ENCOUNTER — Telehealth: Payer: Self-pay

## 2013-08-12 ENCOUNTER — Other Ambulatory Visit: Payer: Self-pay

## 2013-08-12 DIAGNOSIS — I10 Essential (primary) hypertension: Secondary | ICD-10-CM

## 2013-08-12 MED ORDER — OLMESARTAN MEDOXOMIL 20 MG PO TABS: 20.0000 mg | ORAL_TABLET | Freq: Every day | ORAL | Status: DC

## 2013-08-12 NOTE — Telephone Encounter (Signed)
Received pharmacy rejection stating that insurance will not cover  benicar without a prior authorization. PA submitted to [facility]

## 2013-11-04 ENCOUNTER — Ambulatory Visit (INDEPENDENT_AMBULATORY_CARE_PROVIDER_SITE_OTHER): Payer: No Typology Code available for payment source | Admitting: Family Medicine

## 2013-11-04 VITALS — BP 118/82 | HR 75 | Temp 98.1°F | Resp 16 | Ht 66.0 in | Wt 139.8 lb

## 2013-11-04 DIAGNOSIS — S300XXA Contusion of lower back and pelvis, initial encounter: Secondary | ICD-10-CM

## 2013-11-04 NOTE — Progress Notes (Signed)
   Subjective:    Patient ID: Alexandra Swanson, female    DOB: 02-Jun-1965, 48 y.o.   MRN: [id number]  HPI Patient with 2 day history of area of pain on her buttock. She noticed this when she got out of the shower. She noticed that it looks more bruised this morning. Applied bactrim last night. She was sitting on her deck 2 nights ago and got multiple mosquito bites.   Upon further reflection, patient thinks this bruising and pain may be secondary to fingernail puncture.  Past Medical History  Diagnosis Date  . Hypertension   . Hyperlipidemia    Past Surgical History  Procedure Laterality Date  . Cesarean section     FH- mother alive (HTN, high cholesterol), father deceased (diabetes, CAD, HTN, elevated cholesterol) SH- no tobacco, 10-12 drinks ETOH per week, no illicit drugs  Review of Systems No fever or chills, no rash    Objective:   Physical Exam  Vitals reviewed. Constitutional: She is oriented to person, place, and time. She appears well-developed and well-nourished.  HENT:  Head: Normocephalic and atraumatic.  Eyes: Conjunctivae are normal. Right eye exhibits no discharge. Left eye exhibits no discharge.  Neck: Normal range of motion. Neck supple.  Cardiovascular: Normal rate.   Pulmonary/Chest: Effort normal.  Musculoskeletal: Normal range of motion.  Neurological: She is alert and oriented to person, place, and time.  Skin: Skin is warm and dry.     Psychiatric: She has a normal mood and affect. Her behavior is normal. Judgment and thought content normal.      Assessment & Plan:  1. Traumatic ecchymosis of buttock, initial encounter -no sign/symptoms infection -keep area clean, continue to monitor -RTC if no improvement in 2-3 days or if worsening symptoms.   Emi Belfast, FNP-BC  [facility], [facility]  11/04/2013 10:28 AM

## 2013-11-24 ENCOUNTER — Other Ambulatory Visit: Payer: Self-pay | Admitting: Obstetrics & Gynecology

## 2013-11-24 LAB — HM MAMMOGRAPHY

## 2013-11-25 LAB — CYTOLOGY - PAP

## 2013-12-08 ENCOUNTER — Encounter: Payer: Self-pay | Admitting: Internal Medicine

## 2013-12-08 ENCOUNTER — Other Ambulatory Visit: Payer: Self-pay | Admitting: Internal Medicine

## 2013-12-08 DIAGNOSIS — I10 Essential (primary) hypertension: Secondary | ICD-10-CM

## 2013-12-08 MED ORDER — LOSARTAN POTASSIUM 100 MG PO TABS: 100.0000 mg | ORAL_TABLET | Freq: Every day | ORAL | Status: DC

## 2013-12-19 ENCOUNTER — Telehealth: Payer: Self-pay | Admitting: Internal Medicine

## 2013-12-19 NOTE — Telephone Encounter (Signed)
Rec'd from [facility] Ob[facility]etric[facility] Gynecology forward 13 pages to Dr. Yetta Barre

## 2014-01-19 ENCOUNTER — Ambulatory Visit: Payer: Self-pay | Admitting: Internal Medicine

## 2014-01-28 ENCOUNTER — Encounter: Payer: Self-pay | Admitting: Internal Medicine

## 2014-01-28 ENCOUNTER — Other Ambulatory Visit (INDEPENDENT_AMBULATORY_CARE_PROVIDER_SITE_OTHER): Payer: No Typology Code available for payment source

## 2014-01-28 ENCOUNTER — Ambulatory Visit (INDEPENDENT_AMBULATORY_CARE_PROVIDER_SITE_OTHER): Payer: No Typology Code available for payment source | Admitting: Internal Medicine

## 2014-01-28 VITALS — BP 122/86 | HR 71 | Temp 98.5°F | Resp 16 | Ht 66.0 in | Wt 140.0 lb

## 2014-01-28 DIAGNOSIS — Z0000 Encounter for general adult medical examination without abnormal findings: Secondary | ICD-10-CM

## 2014-01-28 DIAGNOSIS — I10 Essential (primary) hypertension: Secondary | ICD-10-CM

## 2014-01-28 DIAGNOSIS — Z23 Encounter for immunization: Secondary | ICD-10-CM

## 2014-01-28 DIAGNOSIS — L259 Unspecified contact dermatitis, unspecified cause: Secondary | ICD-10-CM

## 2014-01-28 DIAGNOSIS — E785 Hyperlipidemia, unspecified: Secondary | ICD-10-CM

## 2014-01-28 DIAGNOSIS — J3089 Other allergic rhinitis: Secondary | ICD-10-CM

## 2014-01-28 DIAGNOSIS — F411 Generalized anxiety disorder: Secondary | ICD-10-CM

## 2014-01-28 LAB — TSH: TSH: 2.47 u[IU]/mL (ref 0.35–4.50)

## 2014-01-28 LAB — COMPREHENSIVE METABOLIC PANEL
ALBUMIN: 4 g/dL (ref 3.5–5.2)
ALK PHOS: 33 U/L — AB (ref 39–117)
ALT: 29 U/L (ref 0–35)
AST: 33 U/L (ref 0–37)
BILIRUBIN TOTAL: 0.8 mg/dL (ref 0.2–1.2)
BUN: 9 mg/dL (ref 6–23)
CO2: 25 mEq/L (ref 19–32)
Calcium: 9 mg/dL (ref 8.4–10.5)
Chloride: 103 mEq/L (ref 96–112)
Creatinine, Ser: 0.7 mg/dL (ref 0.4–1.2)
GFR: 89.26 mL/min (ref >60.00)
GLUCOSE: 94 mg/dL (ref 70–99)
POTASSIUM: 4.9 meq/L (ref 3.5–5.1)
Sodium: 137 mEq/L (ref 135–145)
Total Protein: 7.5 g/dL (ref 6.0–8.3)

## 2014-01-28 LAB — CBC WITH DIFFERENTIAL/PLATELET
Basophils Absolute: 0 10*3/uL (ref 0.0–0.1)
Basophils Relative: 0.1 % (ref 0.0–3.0)
EOS PCT: 0.5 % (ref 0.0–5.0)
Eosinophils Absolute: 0 10*3/uL (ref 0.0–0.7)
HCT: 43.1 % (ref 36.0–46.0)
Hemoglobin: 14.4 g/dL (ref 12.0–15.0)
Lymphocytes Relative: 24 % (ref 12.0–46.0)
Lymphs Abs: 2.4 10*3/uL (ref 0.7–4.0)
MCHC: 33.5 g/dL (ref 30.0–36.0)
MCV: 90.7 fl (ref 78.0–100.0)
MONOS PCT: 5.8 % (ref 3.0–12.0)
Monocytes Absolute: 0.6 10*3/uL (ref 0.1–1.0)
NEUTROS PCT: 69.6 % (ref 43.0–77.0)
Neutro Abs: 6.8 10*3/uL (ref 1.4–7.7)
PLATELETS: 222 10*3/uL (ref 150.0–400.0)
RBC: 4.76 Mil/uL (ref 3.87–5.11)
RDW: 13.2 % (ref 11.5–15.5)
WBC: 9.8 10*3/uL (ref 4.0–10.5)

## 2014-01-28 LAB — LIPID PANEL
Cholesterol: 181 mg/dL (ref 0–200)
HDL: 65.4 mg/dL (ref >39.00)
LDL CALC: 97 mg/dL (ref 0–99)
NonHDL: 115.6
TRIGLYCERIDES: 95 mg/dL (ref 0.0–149.0)
Total CHOL/HDL Ratio: 3
VLDL: 19 mg/dL (ref 0.0–40.0)

## 2014-01-28 NOTE — Progress Notes (Signed)
Pre visit review using our clinic review tool, if applicable. No additional management support is needed unless otherwise documented below in the visit note. 

## 2014-01-28 NOTE — Progress Notes (Signed)
   Subjective:    Patient ID: Alexandra Swanson, female    DOB: 11/05/1965, 48 y.o.   MRN: [id number]  Hypertension This is a chronic problem. The current episode started more than 1 year ago. The problem has been gradually improving since onset. The problem is controlled. Associated symptoms include anxiety. Pertinent negatives include no blurred vision, chest pain, headaches, malaise/fatigue, neck pain, orthopnea, palpitations, peripheral edema, PND, shortness of breath or sweats. Agents associated with hypertension include oral contraceptives. Past treatments include angiotensin blockers. The current treatment provides moderate improvement. There are no compliance problems.       Review of Systems  Constitutional: Negative.  Negative for fever, chills, malaise/fatigue, diaphoresis, appetite change and fatigue.  HENT: Negative.   Eyes: Negative.  Negative for blurred vision.  Respiratory: Negative.  Negative for cough, choking, chest tightness, shortness of breath and stridor.   Cardiovascular: Negative.  Negative for chest pain, palpitations, orthopnea, leg swelling and PND.  Gastrointestinal: Negative.  Negative for nausea, vomiting, abdominal pain, diarrhea, constipation and blood in stool.  Endocrine: Negative.   Genitourinary: Negative.   Musculoskeletal: Negative.  Negative for arthralgias, back pain and neck pain.  Skin: Negative.  Negative for rash.  Allergic/Immunologic: Negative.   Neurological: Negative.  Negative for headaches.  Hematological: Negative.  Negative for adenopathy. Does not bruise/bleed easily.  Psychiatric/Behavioral: Negative.        Objective:   Physical Exam  Vitals reviewed. Constitutional: She is oriented to person, place, and time. She appears well-developed and well-nourished. No distress.  HENT:  Head: Normocephalic and atraumatic.  Mouth/Throat: Oropharynx is clear and moist. No oropharyngeal exudate.  Eyes: Conjunctivae are normal. Right eye  exhibits no discharge. Left eye exhibits no discharge. No scleral icterus.  Neck: Normal range of motion. Neck supple. No JVD present. No tracheal deviation present. No thyromegaly present.  Cardiovascular: Normal rate, regular rhythm, normal heart sounds and intact distal pulses.  Exam reveals no gallop and no friction rub.   No murmur heard. Pulmonary/Chest: Effort normal and breath sounds normal. No stridor. No respiratory distress. She has no wheezes. She has no rales. She exhibits no tenderness.  Abdominal: Soft. Bowel sounds are normal. She exhibits no distension and no mass. There is no tenderness. There is no rebound and no guarding.  Musculoskeletal: Normal range of motion. She exhibits no edema and no tenderness.  Lymphadenopathy:    She has no cervical adenopathy.  Neurological: She is oriented to person, place, and time.  Skin: Skin is warm and dry. No rash noted. She is not diaphoretic. No erythema. No pallor.  Psychiatric: She has a normal mood and affect. Her behavior is normal. Judgment and thought content normal.    Lab Results  Component Value Date   WBC 8.1 01/16/2013   HGB 14.6 01/16/2013   HCT 43.1 01/16/2013   PLT 209.0 01/16/2013   GLUCOSE 79 01/16/2013   CHOL 183 01/16/2013   TRIG 87.0 01/16/2013   HDL 61.30 01/16/2013   LDLCALC 104* 01/16/2013   ALT 22 01/16/2013   AST 20 01/16/2013   NA 139 01/16/2013   K 4.8 01/16/2013   CL 106 01/16/2013   CREATININE 0.6 01/16/2013   BUN 12 01/16/2013   CO2 27 01/16/2013   TSH 2.69 01/16/2013        Assessment & Plan:

## 2014-01-28 NOTE — Assessment & Plan Note (Signed)
Her BP is well controlled I will check her lytes and renal function today 

## 2014-01-28 NOTE — Patient Instructions (Signed)
Preventive Care for Adults A healthy lifestyle and preventive care can promote health and wellness. Preventive health guidelines for women include the following key practices.  A routine yearly physical is a good way to check with your health care provider about your health and preventive screening. It is a chance to share any concerns and updates on your health and to receive a thorough exam.  Visit your dentist for a routine exam and preventive care every 6 months. Brush your teeth twice a day and floss once a day. Good oral hygiene prevents tooth decay and gum disease.  The frequency of eye exams is based on your age, health, family medical history, use of contact lenses, and other factors. Follow your health care provider's recommendations for frequency of eye exams.  Eat a healthy diet. Foods like vegetables, fruits, whole grains, low-fat dairy products, and lean protein foods contain the nutrients you need without too many calories. Decrease your intake of foods high in solid fats, added sugars, and salt. Eat the right amount of calories for you.Get information about a proper diet from your health care provider, if necessary.  Regular physical exercise is one of the most important things you can do for your health. Most adults should get at least 150 minutes of moderate-intensity exercise (any activity that increases your heart rate and causes you to sweat) each week. In addition, most adults need muscle-strengthening exercises on 2 or more days a week.  Maintain a healthy weight. The body mass index (BMI) is a screening tool to identify possible weight problems. It provides an estimate of body fat based on height and weight. Your health care provider can find your BMI and can help you achieve or maintain a healthy weight.For adults 20 years and older:  A BMI below 18.5 is considered underweight.  A BMI of 18.5 to 24.9 is normal.  A BMI of 25 to 29.9 is considered overweight.  A BMI of  30 and above is considered obese.  Maintain normal blood lipids and cholesterol levels by exercising and minimizing your intake of saturated fat. Eat a balanced diet with plenty of fruit and vegetables. Blood tests for lipids and cholesterol should begin at age 76 and be repeated every 5 years. If your lipid or cholesterol levels are high, you are over 50, or you are at high risk for heart disease, you may need your cholesterol levels checked more frequently.Ongoing high lipid and cholesterol levels should be treated with medicines if diet and exercise are not working.  If you smoke, find out from your health care provider how to quit. If you do not use tobacco, do not start.  Lung cancer screening is recommended for adults aged 22-80 years who are at high risk for developing lung cancer because of a history of smoking. A yearly low-dose CT scan of the lungs is recommended for people who have at least a 30-pack-year history of smoking and are a current smoker or have quit within the past 15 years. A pack year of smoking is smoking an average of 1 pack of cigarettes a day for 1 year (for example: 1 pack a day for 30 years or 2 packs a day for 15 years). Yearly screening should continue until the smoker has stopped smoking for at least 15 years. Yearly screening should be stopped for people who develop a health problem that would prevent them from having lung cancer treatment.  If you are pregnant, do not drink alcohol. If you are breastfeeding,  be very cautious about drinking alcohol. If you are not pregnant and choose to drink alcohol, do not have more than 1 drink per day. One drink is considered to be 12 ounces (355 mL) of beer, 5 ounces (148 mL) of wine, or 1.5 ounces (44 mL) of liquor.  Avoid use of street drugs. Do not share needles with anyone. Ask for help if you need support or instructions about stopping the use of drugs.  High blood pressure causes heart disease and increases the risk of  stroke. Your blood pressure should be checked at least every 1 to 2 years. Ongoing high blood pressure should be treated with medicines if weight loss and exercise do not work.  If you are 75-52 years old, ask your health care provider if you should take aspirin to prevent strokes.  Diabetes screening involves taking a blood sample to check your fasting blood sugar level. This should be done once every 3 years, after age 15, if you are within normal weight and without risk factors for diabetes. Testing should be considered at a younger age or be carried out more frequently if you are overweight and have at least 1 risk factor for diabetes.  Breast cancer screening is essential preventive care for women. You should practice "breast self-awareness." This means understanding the normal appearance and feel of your breasts and may include breast self-examination. Any changes detected, no matter how small, should be reported to a health care provider. Women in their 58s and 30s should have a clinical breast exam (CBE) by a health care provider as part of a regular health exam every 1 to 3 years. After age 16, women should have a CBE every year. Starting at age 53, women should consider having a mammogram (breast X-ray test) every year. Women who have a family history of breast cancer should talk to their health care provider about genetic screening. Women at a high risk of breast cancer should talk to their health care providers about having an MRI and a mammogram every year.  Breast cancer gene (BRCA)-related cancer risk assessment is recommended for women who have family members with BRCA-related cancers. BRCA-related cancers include breast, ovarian, tubal, and peritoneal cancers. Having family members with these cancers may be associated with an increased risk for harmful changes (mutations) in the breast cancer genes BRCA1 and BRCA2. Results of the assessment will determine the need for genetic counseling and  BRCA1 and BRCA2 testing.  Routine pelvic exams to screen for cancer are no longer recommended for nonpregnant women who are considered low risk for cancer of the pelvic organs (ovaries, uterus, and vagina) and who do not have symptoms. Ask your health care provider if a screening pelvic exam is right for you.  If you have had past treatment for cervical cancer or a condition that could lead to cancer, you need Pap tests and screening for cancer for at least 20 years after your treatment. If Pap tests have been discontinued, your risk factors (such as having a new sexual partner) need to be reassessed to determine if screening should be resumed. Some women have medical problems that increase the chance of getting cervical cancer. In these cases, your health care provider may recommend more frequent screening and Pap tests.  The HPV test is an additional test that may be used for cervical cancer screening. The HPV test looks for the virus that can cause the cell changes on the cervix. The cells collected during the Pap test can be  tested for HPV. The HPV test could be used to screen women aged 30 years and older, and should be used in women of any age who have unclear Pap test results. After the age of 30, women should have HPV testing at the same frequency as a Pap test.  Colorectal cancer can be detected and often prevented. Most routine colorectal cancer screening begins at the age of 50 years and continues through age 75 years. However, your health care provider may recommend screening at an earlier age if you have risk factors for colon cancer. On a yearly basis, your health care provider may provide home test kits to check for hidden blood in the stool. Use of a small camera at the end of a tube, to directly examine the colon (sigmoidoscopy or colonoscopy), can detect the earliest forms of colorectal cancer. Talk to your health care provider about this at age 50, when routine screening begins. Direct  exam of the colon should be repeated every 5-10 years through age 75 years, unless early forms of pre-cancerous polyps or small growths are found.  People who are at an increased risk for hepatitis B should be screened for this virus. You are considered at high risk for hepatitis B if:  You were born in a country where hepatitis B occurs often. Talk with your health care provider about which countries are considered high risk.  Your parents were born in a high-risk country and you have not received a shot to protect against hepatitis B (hepatitis B vaccine).  You have HIV or AIDS.  You use needles to inject street drugs.  You live with, or have sex with, someone who has hepatitis B.  You get hemodialysis treatment.  You take certain medicines for conditions like cancer, organ transplantation, and autoimmune conditions.  Hepatitis C blood testing is recommended for all people born from 1945 through 1965 and any individual with known risks for hepatitis C.  Practice safe sex. Use condoms and avoid high-risk sexual practices to reduce the spread of sexually transmitted infections (STIs). STIs include gonorrhea, chlamydia, syphilis, trichomonas, herpes, HPV, and human immunodeficiency virus (HIV). Herpes, HIV, and HPV are viral illnesses that have no cure. They can result in disability, cancer, and death.  You should be screened for sexually transmitted illnesses (STIs) including gonorrhea and chlamydia if:  You are sexually active and are younger than 24 years.  You are older than 24 years and your health care provider tells you that you are at risk for this type of infection.  Your sexual activity has changed since you were last screened and you are at an increased risk for chlamydia or gonorrhea. Ask your health care provider if you are at risk.  If you are at risk of being infected with HIV, it is recommended that you take a prescription medicine daily to prevent HIV infection. This is  called preexposure prophylaxis (PrEP). You are considered at risk if:  You are a heterosexual woman, are sexually active, and are at increased risk for HIV infection.  You take drugs by injection.  You are sexually active with a partner who has HIV.  Talk with your health care provider about whether you are at high risk of being infected with HIV. If you choose to begin PrEP, you should first be tested for HIV. You should then be tested every 3 months for as long as you are taking PrEP.  Osteoporosis is a disease in which the bones lose minerals and strength   with aging. This can result in serious bone fractures or breaks. The risk of osteoporosis can be identified using a bone density scan. Women ages 65 years and over and women at risk for fractures or osteoporosis should discuss screening with their health care providers. Ask your health care provider whether you should take a calcium supplement or vitamin D to reduce the rate of osteoporosis.  Menopause can be associated with physical symptoms and risks. Hormone replacement therapy is available to decrease symptoms and risks. You should talk to your health care provider about whether hormone replacement therapy is right for you.  Use sunscreen. Apply sunscreen liberally and repeatedly throughout the day. You should seek shade when your shadow is shorter than you. Protect yourself by wearing long sleeves, pants, a wide-brimmed hat, and sunglasses year round, whenever you are outdoors.  Once a month, do a whole body skin exam, using a mirror to look at the skin on your back. Tell your health care provider of new moles, moles that have irregular borders, moles that are larger than a pencil eraser, or moles that have changed in shape or color.  Stay current with required vaccines (immunizations).  Influenza vaccine. All adults should be immunized every year.  Tetanus, diphtheria, and acellular pertussis (Td, Tdap) vaccine. Pregnant women should  receive 1 dose of Tdap vaccine during each pregnancy. The dose should be obtained regardless of the length of time since the last dose. Immunization is preferred during the 27th-36th week of gestation. An adult who has not previously received Tdap or who does not know her vaccine status should receive 1 dose of Tdap. This initial dose should be followed by tetanus and diphtheria toxoids (Td) booster doses every 10 years. Adults with an unknown or incomplete history of completing a 3-dose immunization series with Td-containing vaccines should begin or complete a primary immunization series including a Tdap dose. Adults should receive a Td booster every 10 years.  Varicella vaccine. An adult without evidence of immunity to varicella should receive 2 doses or a second dose if she has previously received 1 dose. Pregnant females who do not have evidence of immunity should receive the first dose after pregnancy. This first dose should be obtained before leaving the health care facility. The second dose should be obtained 4-8 weeks after the first dose.  Human papillomavirus (HPV) vaccine. Females aged 13-26 years who have not received the vaccine previously should obtain the 3-dose series. The vaccine is not recommended for use in pregnant females. However, pregnancy testing is not needed before receiving a dose. If a female is found to be pregnant after receiving a dose, no treatment is needed. In that case, the remaining doses should be delayed until after the pregnancy. Immunization is recommended for any person with an immunocompromised condition through the age of 26 years if she did not get any or all doses earlier. During the 3-dose series, the second dose should be obtained 4-8 weeks after the first dose. The third dose should be obtained 24 weeks after the first dose and 16 weeks after the second dose.  Zoster vaccine. One dose is recommended for adults aged 60 years or older unless certain conditions are  present.  Measles, mumps, and rubella (MMR) vaccine. Adults born before 1957 generally are considered immune to measles and mumps. Adults born in 1957 or later should have 1 or more doses of MMR vaccine unless there is a contraindication to the vaccine or there is laboratory evidence of immunity to   each of the three diseases. A routine second dose of MMR vaccine should be obtained at least 28 days after the first dose for students attending postsecondary schools, health care workers, or international travelers. People who received inactivated measles vaccine or an unknown type of measles vaccine during 1963-1967 should receive 2 doses of MMR vaccine. People who received inactivated mumps vaccine or an unknown type of mumps vaccine before 1979 and are at high risk for mumps infection should consider immunization with 2 doses of MMR vaccine. For females of childbearing age, rubella immunity should be determined. If there is no evidence of immunity, females who are not pregnant should be vaccinated. If there is no evidence of immunity, females who are pregnant should delay immunization until after pregnancy. Unvaccinated health care workers born before 1957 who lack laboratory evidence of measles, mumps, or rubella immunity or laboratory confirmation of disease should consider measles and mumps immunization with 2 doses of MMR vaccine or rubella immunization with 1 dose of MMR vaccine.  Pneumococcal 13-valent conjugate (PCV13) vaccine. When indicated, a person who is uncertain of her immunization history and has no record of immunization should receive the PCV13 vaccine. An adult aged 19 years or older who has certain medical conditions and has not been previously immunized should receive 1 dose of PCV13 vaccine. This PCV13 should be followed with a dose of pneumococcal polysaccharide (PPSV23) vaccine. The PPSV23 vaccine dose should be obtained at least 8 weeks after the dose of PCV13 vaccine. An adult aged 19  years or older who has certain medical conditions and previously received 1 or more doses of PPSV23 vaccine should receive 1 dose of PCV13. The PCV13 vaccine dose should be obtained 1 or more years after the last PPSV23 vaccine dose.  Pneumococcal polysaccharide (PPSV23) vaccine. When PCV13 is also indicated, PCV13 should be obtained first. All adults aged 65 years and older should be immunized. An adult younger than age 65 years who has certain medical conditions should be immunized. Any person who resides in a nursing home or long-term care facility should be immunized. An adult smoker should be immunized. People with an immunocompromised condition and certain other conditions should receive both PCV13 and PPSV23 vaccines. People with human immunodeficiency virus (HIV) infection should be immunized as soon as possible after diagnosis. Immunization during chemotherapy or radiation therapy should be avoided. Routine use of PPSV23 vaccine is not recommended for American Indians, [place] Natives, or people younger than 65 years unless there are medical conditions that require PPSV23 vaccine. When indicated, people who have unknown immunization and have no record of immunization should receive PPSV23 vaccine. One-time revaccination 5 years after the first dose of PPSV23 is recommended for people aged 19-64 years who have chronic kidney failure, nephrotic syndrome, asplenia, or immunocompromised conditions. People who received 1-2 doses of PPSV23 before age 65 years should receive another dose of PPSV23 vaccine at age 65 years or later if at least 5 years have passed since the previous dose. Doses of PPSV23 are not needed for people immunized with PPSV23 at or after age 65 years.  Meningococcal vaccine. Adults with asplenia or persistent complement component deficiencies should receive 2 doses of quadrivalent meningococcal conjugate (MenACWY-D) vaccine. The doses should be obtained at least 2 months apart.  Microbiologists working with certain meningococcal bacteria, military recruits, people at risk during an outbreak, and people who travel to or live in countries with a high rate of meningitis should be immunized. A first-year college student up through age   21 years who is living in a residence hall should receive a dose if she did not receive a dose on or after her 16th birthday. Adults who have certain high-risk conditions should receive one or more doses of vaccine.  Hepatitis A vaccine. Adults who wish to be protected from this disease, have certain high-risk conditions, work with hepatitis A-infected animals, work in hepatitis A research labs, or travel to or work in countries with a high rate of hepatitis A should be immunized. Adults who were previously unvaccinated and who anticipate close contact with an international adoptee during the first 60 days after arrival in the [place] from a country with a high rate of hepatitis A should be immunized.  Hepatitis B vaccine. Adults who wish to be protected from this disease, have certain high-risk conditions, may be exposed to blood or other infectious body fluids, are household contacts or sex partners of hepatitis B positive people, are clients or workers in certain care facilities, or travel to or work in countries with a high rate of hepatitis B should be immunized.  Haemophilus influenzae type b (Hib) vaccine. A previously unvaccinated person with asplenia or sickle cell disease or having a scheduled splenectomy should receive 1 dose of Hib vaccine. Regardless of previous immunization, a recipient of a hematopoietic stem cell transplant should receive a 3-dose series 6-12 months after her successful transplant. Hib vaccine is not recommended for adults with HIV infection. Preventive Services / Frequency Ages 64 to 68 years  Blood pressure check.** / Every 1 to 2 years.  Lipid and cholesterol check.** / Every 5 years beginning at age  22.  Clinical breast exam.** / Every 3 years for women in their 88s and 53s.  BRCA-related cancer risk assessment.** / For women who have family members with a BRCA-related cancer (breast, ovarian, tubal, or peritoneal cancers).  Pap test.** / Every 2 years from ages 90 through 51. Every 3 years starting at age 21 through age 56 or 3 with a history of 3 consecutive normal Pap tests.  HPV screening.** / Every 3 years from ages 24 through ages 1 to 46 with a history of 3 consecutive normal Pap tests.  Hepatitis C blood test.** / For any individual with known risks for hepatitis C.  Skin self-exam. / Monthly.  Influenza vaccine. / Every year.  Tetanus, diphtheria, and acellular pertussis (Tdap, Td) vaccine.** / Consult your health care provider. Pregnant women should receive 1 dose of Tdap vaccine during each pregnancy. 1 dose of Td every 10 years.  Varicella vaccine.** / Consult your health care provider. Pregnant females who do not have evidence of immunity should receive the first dose after pregnancy.  HPV vaccine. / 3 doses over 6 months, if 72 and younger. The vaccine is not recommended for use in pregnant females. However, pregnancy testing is not needed before receiving a dose.  Measles, mumps, rubella (MMR) vaccine.** / You need at least 1 dose of MMR if you were born in 1957 or later. You may also need a 2nd dose. For females of childbearing age, rubella immunity should be determined. If there is no evidence of immunity, females who are not pregnant should be vaccinated. If there is no evidence of immunity, females who are pregnant should delay immunization until after pregnancy.  Pneumococcal 13-valent conjugate (PCV13) vaccine.** / Consult your health care provider.  Pneumococcal polysaccharide (PPSV23) vaccine.** / 1 to 2 doses if you smoke cigarettes or if you have certain conditions.  Meningococcal vaccine.** /  1 dose if you are age 19 to 21 years and a first-year college  student living in a residence hall, or have one of several medical conditions, you need to get vaccinated against meningococcal disease. You may also need additional booster doses.  Hepatitis A vaccine.** / Consult your health care provider.  Hepatitis B vaccine.** / Consult your health care provider.  Haemophilus influenzae type b (Hib) vaccine.** / Consult your health care provider. Ages 40 to 64 years  Blood pressure check.** / Every 1 to 2 years.  Lipid and cholesterol check.** / Every 5 years beginning at age 20 years.  Lung cancer screening. / Every year if you are aged 55-80 years and have a 30-pack-year history of smoking and currently smoke or have quit within the past 15 years. Yearly screening is stopped once you have quit smoking for at least 15 years or develop a health problem that would prevent you from having lung cancer treatment.  Clinical breast exam.** / Every year after age 40 years.  BRCA-related cancer risk assessment.** / For women who have family members with a BRCA-related cancer (breast, ovarian, tubal, or peritoneal cancers).  Mammogram.** / Every year beginning at age 40 years and continuing for as long as you are in good health. Consult with your health care provider.  Pap test.** / Every 3 years starting at age 30 years through age 65 or 70 years with a history of 3 consecutive normal Pap tests.  HPV screening.** / Every 3 years from ages 30 years through ages 65 to 70 years with a history of 3 consecutive normal Pap tests.  Fecal occult blood test (FOBT) of stool. / Every year beginning at age 50 years and continuing until age 75 years. You may not need to do this test if you get a colonoscopy every 10 years.  Flexible sigmoidoscopy or colonoscopy.** / Every 5 years for a flexible sigmoidoscopy or every 10 years for a colonoscopy beginning at age 50 years and continuing until age 75 years.  Hepatitis C blood test.** / For all people born from 1945 through  1965 and any individual with known risks for hepatitis C.  Skin self-exam. / Monthly.  Influenza vaccine. / Every year.  Tetanus, diphtheria, and acellular pertussis (Tdap/Td) vaccine.** / Consult your health care provider. Pregnant women should receive 1 dose of Tdap vaccine during each pregnancy. 1 dose of Td every 10 years.  Varicella vaccine.** / Consult your health care provider. Pregnant females who do not have evidence of immunity should receive the first dose after pregnancy.  Zoster vaccine.** / 1 dose for adults aged 60 years or older.  Measles, mumps, rubella (MMR) vaccine.** / You need at least 1 dose of MMR if you were born in 1957 or later. You may also need a 2nd dose. For females of childbearing age, rubella immunity should be determined. If there is no evidence of immunity, females who are not pregnant should be vaccinated. If there is no evidence of immunity, females who are pregnant should delay immunization until after pregnancy.  Pneumococcal 13-valent conjugate (PCV13) vaccine.** / Consult your health care provider.  Pneumococcal polysaccharide (PPSV23) vaccine.** / 1 to 2 doses if you smoke cigarettes or if you have certain conditions.  Meningococcal vaccine.** / Consult your health care provider.  Hepatitis A vaccine.** / Consult your health care provider.  Hepatitis B vaccine.** / Consult your health care provider.  Haemophilus influenzae type b (Hib) vaccine.** / Consult your health care provider. Ages 65   years and over  Blood pressure check.** / Every 1 to 2 years.  Lipid and cholesterol check.** / Every 5 years beginning at age 22 years.  Lung cancer screening. / Every year if you are aged 73-80 years and have a 30-pack-year history of smoking and currently smoke or have quit within the past 15 years. Yearly screening is stopped once you have quit smoking for at least 15 years or develop a health problem that would prevent you from having lung cancer  treatment.  Clinical breast exam.** / Every year after age 4 years.  BRCA-related cancer risk assessment.** / For women who have family members with a BRCA-related cancer (breast, ovarian, tubal, or peritoneal cancers).  Mammogram.** / Every year beginning at age 40 years and continuing for as long as you are in good health. Consult with your health care provider.  Pap test.** / Every 3 years starting at age 9 years through age 34 or 91 years with 3 consecutive normal Pap tests. Testing can be stopped between 65 and 70 years with 3 consecutive normal Pap tests and no abnormal Pap or HPV tests in the past 10 years.  HPV screening.** / Every 3 years from ages 57 years through ages 64 or 45 years with a history of 3 consecutive normal Pap tests. Testing can be stopped between 65 and 70 years with 3 consecutive normal Pap tests and no abnormal Pap or HPV tests in the past 10 years.  Fecal occult blood test (FOBT) of stool. / Every year beginning at age 15 years and continuing until age 17 years. You may not need to do this test if you get a colonoscopy every 10 years.  Flexible sigmoidoscopy or colonoscopy.** / Every 5 years for a flexible sigmoidoscopy or every 10 years for a colonoscopy beginning at age 86 years and continuing until age 71 years.  Hepatitis C blood test.** / For all people born from [birth year] through 1964 and any individual with known risks for hepatitis C.  Osteoporosis screening.** / A one-time screening for women ages 83 years and over and women at risk for fractures or osteoporosis.  Skin self-exam. / Monthly.  Influenza vaccine. / Every year.  Tetanus, diphtheria, and acellular pertussis (Tdap/Td) vaccine.** / 1 dose of Td every 10 years.  Varicella vaccine.** / Consult your health care provider.  Zoster vaccine.** / 1 dose for adults aged 61 years or older.  Pneumococcal 13-valent conjugate (PCV13) vaccine.** / Consult your health care provider.  Pneumococcal  polysaccharide (PPSV23) vaccine.** / 1 dose for all adults aged 28 years and older.  Meningococcal vaccine.** / Consult your health care provider.  Hepatitis A vaccine.** / Consult your health care provider.  Hepatitis B vaccine.** / Consult your health care provider.  Haemophilus influenzae type b (Hib) vaccine.** / Consult your health care provider. ** Family history and personal history of risk and conditions may change your health care provider's recommendations. Document Released: 06/20/2001 Document Revised: 09/08/2013 Document Reviewed: 09/19/2010 [facility] Patient Information 2015 [place], [place]. This information is not intended to replace advice given to you by your health care provider. Make sure you discuss any questions you have with your health care provider.

## 2014-01-28 NOTE — Assessment & Plan Note (Signed)
Exam done Vaccines were reviewed and updated Labs ordered Pt ed material was given 

## 2014-01-28 NOTE — Assessment & Plan Note (Signed)
She has achieved her LDL goal 

## 2014-01-29 MED ORDER — ALPRAZOLAM 0.25 MG PO TABS: 0.2500 mg | ORAL_TABLET | Freq: Two times a day (BID) | ORAL | Status: DC | PRN

## 2014-01-29 MED ORDER — MONTELUKAST SODIUM 10 MG PO TABS: 10.0000 mg | ORAL_TABLET | Freq: Every day | ORAL | Status: DC

## 2014-01-29 NOTE — Addendum Note (Signed)
Addended by: Etta Grandchild on: 01/29/2014 01:55 PM   Modules accepted: Orders

## 2014-04-16 ENCOUNTER — Encounter: Payer: Self-pay | Admitting: Internal Medicine

## 2014-07-27 ENCOUNTER — Telehealth: Payer: Self-pay

## 2014-07-27 MED ORDER — ALPRAZOLAM 0.25 MG PO TABS: 0.2500 mg | ORAL_TABLET | Freq: Two times a day (BID) | ORAL | Status: DC | PRN

## 2014-07-27 NOTE — Telephone Encounter (Signed)
Received Rx request for ALPRAZolam (XANAX) 0.25 MG tablet . Thanks

## 2014-10-14 ENCOUNTER — Other Ambulatory Visit: Payer: Self-pay | Admitting: Internal Medicine

## 2014-10-14 NOTE — Telephone Encounter (Signed)
Rx faxed to pharmacy by [facility]...Raechel Chute

## 2014-10-19 ENCOUNTER — Encounter: Payer: Self-pay | Admitting: Internal Medicine

## 2014-10-19 DIAGNOSIS — I10 Essential (primary) hypertension: Secondary | ICD-10-CM

## 2014-10-19 MED ORDER — LOSARTAN POTASSIUM 100 MG PO TABS: 100.0000 mg | ORAL_TABLET | Freq: Every day | ORAL | Status: DC

## 2014-10-19 MED ORDER — MONTELUKAST SODIUM 10 MG PO TABS: 10.0000 mg | ORAL_TABLET | Freq: Every day | ORAL | Status: DC

## 2014-12-08 ENCOUNTER — Other Ambulatory Visit: Payer: Self-pay | Admitting: Internal Medicine

## 2015-01-27 ENCOUNTER — Encounter: Payer: Self-pay | Admitting: Internal Medicine

## 2015-01-27 LAB — HM MAMMOGRAPHY

## 2015-02-02 ENCOUNTER — Other Ambulatory Visit (INDEPENDENT_AMBULATORY_CARE_PROVIDER_SITE_OTHER): Payer: No Typology Code available for payment source

## 2015-02-02 ENCOUNTER — Encounter: Payer: Self-pay | Admitting: Internal Medicine

## 2015-02-02 ENCOUNTER — Ambulatory Visit (INDEPENDENT_AMBULATORY_CARE_PROVIDER_SITE_OTHER): Payer: No Typology Code available for payment source | Admitting: Internal Medicine

## 2015-02-02 VITALS — BP 128/88 | HR 71 | Temp 98.2°F | Resp 16 | Ht 66.0 in | Wt 138.0 lb

## 2015-02-02 DIAGNOSIS — I10 Essential (primary) hypertension: Secondary | ICD-10-CM

## 2015-02-02 DIAGNOSIS — Z0000 Encounter for general adult medical examination without abnormal findings: Secondary | ICD-10-CM | POA: Diagnosis not present

## 2015-02-02 DIAGNOSIS — J301 Allergic rhinitis due to pollen: Secondary | ICD-10-CM

## 2015-02-02 DIAGNOSIS — Z23 Encounter for immunization: Secondary | ICD-10-CM

## 2015-02-02 DIAGNOSIS — F411 Generalized anxiety disorder: Secondary | ICD-10-CM

## 2015-02-02 LAB — CBC WITH DIFFERENTIAL/PLATELET
BASOS PCT: 0.3 % (ref 0.0–3.0)
Basophils Absolute: 0 10*3/uL (ref 0.0–0.1)
EOS ABS: 0.1 10*3/uL (ref 0.0–0.7)
EOS PCT: 0.7 % (ref 0.0–5.0)
HEMATOCRIT: 42.7 % (ref 36.0–46.0)
HEMOGLOBIN: 14.2 g/dL (ref 12.0–15.0)
LYMPHS PCT: 22.8 % (ref 12.0–46.0)
Lymphs Abs: 1.9 10*3/uL (ref 0.7–4.0)
MCHC: 33.3 g/dL (ref 30.0–36.0)
MCV: 91.5 fl (ref 78.0–100.0)
MONOS PCT: 5.6 % (ref 3.0–12.0)
Monocytes Absolute: 0.5 10*3/uL (ref 0.1–1.0)
Neutro Abs: 5.8 10*3/uL (ref 1.4–7.7)
Neutrophils Relative %: 70.6 % (ref 43.0–77.0)
Platelets: 238 10*3/uL (ref 150.0–400.0)
RBC: 4.66 Mil/uL (ref 3.87–5.11)
RDW: 13.4 % (ref 11.5–15.5)
WBC: 8.2 10*3/uL (ref 4.0–10.5)

## 2015-02-02 LAB — COMPREHENSIVE METABOLIC PANEL
ALBUMIN: 4.1 g/dL (ref 3.5–5.2)
ALT: 17 U/L (ref 0–35)
AST: 18 U/L (ref 0–37)
Alkaline Phosphatase: 31 U/L — ABNORMAL LOW (ref 39–117)
BUN: 13 mg/dL (ref 6–23)
CHLORIDE: 106 meq/L (ref 96–112)
CO2: 26 meq/L (ref 19–32)
CREATININE: 0.69 mg/dL (ref 0.40–1.20)
Calcium: 9.2 mg/dL (ref 8.4–10.5)
GFR: 96.36 mL/min (ref >60.00)
Glucose, Bld: 99 mg/dL (ref 70–99)
POTASSIUM: 5.2 meq/L — AB (ref 3.5–5.1)
Sodium: 141 mEq/L (ref 135–145)
Total Bilirubin: 0.7 mg/dL (ref 0.2–1.2)
Total Protein: 7.1 g/dL (ref 6.0–8.3)

## 2015-02-02 LAB — LIPID PANEL
CHOL/HDL RATIO: 2
Cholesterol: 181 mg/dL (ref 0–200)
HDL: 75.4 mg/dL (ref >39.00)
LDL CALC: 90 mg/dL (ref 0–99)
NonHDL: 105.24
TRIGLYCERIDES: 76 mg/dL (ref 0.0–149.0)
VLDL: 15.2 mg/dL (ref 0.0–40.0)

## 2015-02-02 LAB — TSH: TSH: 2.47 u[IU]/mL (ref 0.35–4.50)

## 2015-02-02 MED ORDER — ALPRAZOLAM 0.25 MG PO TABS: 0.2500 mg | ORAL_TABLET | Freq: Two times a day (BID) | ORAL | Status: DC | PRN

## 2015-02-02 NOTE — Patient Instructions (Signed)

## 2015-02-02 NOTE — Progress Notes (Signed)
Subjective:  Patient ID: Alexandra Swanson, female    DOB: 06/22/1965  Age: 49 y.o. MRN: [id number]  CC: Hypertension and Annual Exam   HPI Alexandra Swanson presents for a CPX and BP check. She feels well and other than the rare episode of anxiety she offers no complaints.  Outpatient Prescriptions Prior to Visit  Medication Sig Dispense Refill  . aspirin 81 MG tablet Take 81 mg by mouth daily.      . fluticasone (FLONASE) 50 MCG/ACT nasal spray USE TWO SPRAY IN EACH NOSTRIL EVERY DAY 16 g 5  . losartan (COZAAR) 100 MG tablet Take 1 tablet (100 mg total) by mouth daily. 90 tablet 3  . montelukast (SINGULAIR) 10 MG tablet Take 1 tablet (10 mg total) by mouth at bedtime. 90 tablet 3  . Norethindrone Acetate-Ethinyl Estrad-FE (LOESTRIN 24 FE) 1-20 MG-MCG(24) tablet Take 1 tablet by mouth daily.      Marland Kitchen ALPRAZolam (XANAX) 0.25 MG tablet TAKE 1 TABLET BY MOUTH TWICE DAILY AS NEEDED FOR ANXIETY 30 tablet 1  . fluconazole (DIFLUCAN) 150 MG tablet     . PATANOL 0.1 % ophthalmic solution      No facility-administered medications prior to visit.    ROS Review of Systems  Constitutional: Negative.  Negative for fever, chills, diaphoresis, appetite change and fatigue.  HENT: Negative.  Negative for congestion, sinus pressure, tinnitus and trouble swallowing.   Eyes: Negative.   Respiratory: Negative.  Negative for cough, choking, chest tightness, shortness of breath and stridor.   Cardiovascular: Negative.  Negative for chest pain, palpitations and leg swelling.  Gastrointestinal: Negative.  Negative for nausea, vomiting, abdominal pain, diarrhea, constipation and blood in stool.  Endocrine: Negative.   Genitourinary: Negative.  Negative for hematuria and difficulty urinating.  Musculoskeletal: Negative.   Skin: Negative.  Negative for rash.  Allergic/Immunologic: Negative.   Neurological: Negative.  Negative for dizziness, tremors, weakness, light-headedness, numbness and headaches.    Hematological: Negative.  Negative for adenopathy. Does not bruise/bleed easily.  Psychiatric/Behavioral: Negative for suicidal ideas, sleep disturbance, dysphoric mood, decreased concentration and agitation. The patient is nervous/anxious.     Objective:  BP 128/88 mmHg  Pulse 71  Temp(Src) 98.2 F (36.8 C) (Oral)  Resp 16  Ht 5\' 6"  (1.676 m)  Wt 138 lb (62.596 kg)  BMI 22.28 kg/m2  SpO2 98%  BP Readings from Last 3 Encounters:  02/02/15 128/88  01/28/14 122/86  11/04/13 118/82    Wt Readings from Last 3 Encounters:  02/02/15 138 lb (62.596 kg)  01/28/14 140 lb (63.504 kg)  11/04/13 139 lb 12.8 oz (63.413 kg)    Physical Exam  Constitutional: She is oriented to person, place, and time. She appears well-developed and well-nourished. No distress.  HENT:  Mouth/Throat: Oropharynx is clear and moist. No oropharyngeal exudate.  Eyes: Conjunctivae are normal. Right eye exhibits no discharge. Left eye exhibits no discharge. No scleral icterus.  Neck: Normal range of motion. Neck supple. No JVD present. No tracheal deviation present. No thyromegaly present.  Cardiovascular: Normal rate, regular rhythm, normal heart sounds and intact distal pulses.  Exam reveals no gallop and no friction rub.   No murmur heard. Pulmonary/Chest: Effort normal and breath sounds normal. No stridor. No respiratory distress. She has no wheezes. She has no rales. She exhibits no tenderness.  Abdominal: Soft. Bowel sounds are normal. She exhibits no distension and no mass. There is no tenderness. There is no rebound and no guarding.  Musculoskeletal: Normal range of motion.  She exhibits no edema or tenderness.  Lymphadenopathy:    She has no cervical adenopathy.  Neurological: She is oriented to person, place, and time.  Skin: Skin is warm and dry. No rash noted. She is not diaphoretic. No erythema. No pallor.  Psychiatric: She has a normal mood and affect. Her behavior is normal. Judgment and thought  content normal.  Vitals reviewed.   Lab Results  Component Value Date   WBC 9.8 01/28/2014   HGB 14.4 01/28/2014   HCT 43.1 01/28/2014   PLT 222.0 01/28/2014   GLUCOSE 94 01/28/2014   CHOL 181 01/28/2014   TRIG 95.0 01/28/2014   HDL 65.40 01/28/2014   LDLCALC 97 01/28/2014   ALT 29 01/28/2014   AST 33 01/28/2014   NA 137 01/28/2014   K 4.9 01/28/2014   CL 103 01/28/2014   CREATININE 0.7 01/28/2014   BUN 9 01/28/2014   CO2 25 01/28/2014   TSH 2.47 01/28/2014    No results found.  Assessment & Plan:   Annete was seen today for hypertension and annual exam.  Diagnoses and all orders for this visit:  Essential hypertension- her BP is well controlled, will monitor her lytes and renal function  Allergic rhinitis due to pollen- cont current meds  ANXIETY DISORDER, GENERALIZED -     ALPRAZolam (XANAX) 0.25 MG tablet; Take 1 tablet (0.25 mg total) by mouth 2 (two) times daily as needed. for anxiety  Routine general medical examination at a health care facility- exam done, vaccines were updated, labs ordered, she was asked to drink < 3 EToHic beverages per day, PAP and mammogram are UTD, pt ed material was given -     Lipid panel; Future -     Comprehensive metabolic panel; Future -     CBC with Differential/Platelet; Future -     TSH; Future  Need for influenza vaccination -     Flu Vaccine QUAD 36+ mos IM  I have discontinued Ms. Lesage's PATANOL and fluconazole. I have also changed her ALPRAZolam. Additionally, I am having her maintain her Norethindrone Acetate-Ethinyl Estrad-FE, aspirin, fluticasone, montelukast, and losartan.  Meds ordered this encounter  Medications  . ALPRAZolam (XANAX) 0.25 MG tablet    Sig: Take 1 tablet (0.25 mg total) by mouth 2 (two) times daily as needed. for anxiety    Dispense:  30 tablet    Refill:  3    CYCLE FILL MEDICATION. Authorization is required for next refill.     Follow-up: Return in about 6 months (around  08/02/2015).  Sanda Linger, MD

## 2015-02-02 NOTE — Progress Notes (Signed)
Pre visit review using our clinic review tool, if applicable. No additional management support is needed unless otherwise documented below in the visit note. 

## 2015-07-05 ENCOUNTER — Other Ambulatory Visit: Payer: Self-pay | Admitting: Internal Medicine

## 2015-07-05 ENCOUNTER — Other Ambulatory Visit: Payer: Self-pay

## 2015-07-05 DIAGNOSIS — F411 Generalized anxiety disorder: Secondary | ICD-10-CM

## 2015-07-05 MED ORDER — ALPRAZOLAM 0.25 MG PO TABS
0.2500 mg | ORAL_TABLET | Freq: Two times a day (BID) | ORAL | Status: DC | PRN
Start: 2015-07-05 — End: 2016-03-02

## 2015-07-05 MED ORDER — MONTELUKAST SODIUM 10 MG PO TABS: 10.0000 mg | ORAL_TABLET | Freq: Every day | ORAL | Status: DC

## 2015-10-03 ENCOUNTER — Encounter: Payer: Self-pay | Admitting: Internal Medicine

## 2015-10-05 MED ORDER — MONTELUKAST SODIUM 10 MG PO TABS: 10.0000 mg | ORAL_TABLET | Freq: Every day | ORAL | Status: DC

## 2015-10-06 ENCOUNTER — Other Ambulatory Visit: Payer: Self-pay | Admitting: *Deleted

## 2015-10-06 MED ORDER — MONTELUKAST SODIUM 10 MG PO TABS: 10.0000 mg | ORAL_TABLET | Freq: Every day | ORAL | Status: DC

## 2015-12-31 ENCOUNTER — Inpatient Hospital Stay (HOSPITAL_COMMUNITY): Admission: AD | Admit: 2015-12-31 | Payer: BLUE CROSS/BLUE SHIELD | Source: Ambulatory Visit | Admitting: Cardiology

## 2015-12-31 ENCOUNTER — Encounter (HOSPITAL_COMMUNITY): Payer: Self-pay | Admitting: Cardiology

## 2015-12-31 ENCOUNTER — Inpatient Hospital Stay (HOSPITAL_COMMUNITY)
Admission: EM | Admit: 2015-12-31 | Discharge: 2016-01-02 | DRG: 247 | Disposition: A | Discharge status: Home / Self Care | Payer: BLUE CROSS/BLUE SHIELD | Source: Ambulatory Visit | Attending: Cardiology | Admitting: Cardiology

## 2015-12-31 ENCOUNTER — Inpatient Hospital Stay (HOSPITAL_COMMUNITY)
Admission: EM | Disposition: A | Discharge status: Home / Self Care | Payer: Self-pay | Source: Ambulatory Visit | Attending: Cardiology

## 2015-12-31 DIAGNOSIS — I255 Ischemic cardiomyopathy: Secondary | ICD-10-CM | POA: Diagnosis present

## 2015-12-31 DIAGNOSIS — Z7982 Long term (current) use of aspirin: Secondary | ICD-10-CM | POA: Diagnosis not present

## 2015-12-31 DIAGNOSIS — Z955 Presence of coronary angioplasty implant and graft: Secondary | ICD-10-CM | POA: Diagnosis not present

## 2015-12-31 DIAGNOSIS — Z881 Allergy status to other antibiotic agents status: Secondary | ICD-10-CM | POA: Diagnosis not present

## 2015-12-31 DIAGNOSIS — I219 Acute myocardial infarction, unspecified: Secondary | ICD-10-CM

## 2015-12-31 DIAGNOSIS — Z79899 Other long term (current) drug therapy: Secondary | ICD-10-CM

## 2015-12-31 DIAGNOSIS — Z8249 Family history of ischemic heart disease and other diseases of the circulatory system: Secondary | ICD-10-CM | POA: Diagnosis not present

## 2015-12-31 DIAGNOSIS — R55 Syncope and collapse: Secondary | ICD-10-CM | POA: Diagnosis present

## 2015-12-31 DIAGNOSIS — E785 Hyperlipidemia, unspecified: Secondary | ICD-10-CM | POA: Diagnosis present

## 2015-12-31 DIAGNOSIS — I10 Essential (primary) hypertension: Secondary | ICD-10-CM | POA: Diagnosis present

## 2015-12-31 DIAGNOSIS — I2109 ST elevation (STEMI) myocardial infarction involving other coronary artery of anterior wall: Secondary | ICD-10-CM | POA: Diagnosis present

## 2015-12-31 HISTORY — DX: Acute myocardial infarction, unspecified: I21.9

## 2015-12-31 HISTORY — PX: CARDIAC CATHETERIZATION: SHX172

## 2015-12-31 LAB — LIPID PANEL
CHOLESTEROL: 171 mg/dL (ref 0–200)
HDL: 62 mg/dL (ref >40)
LDL CALC: 94 mg/dL (ref 0–99)
TRIGLYCERIDES: 76 mg/dL (ref <150)
Total CHOL/HDL Ratio: 2.8 RATIO
VLDL: 15 mg/dL (ref 0–40)

## 2015-12-31 LAB — COMPREHENSIVE METABOLIC PANEL
ALK PHOS: 31 U/L — AB (ref 38–126)
ALT: 19 U/L (ref 14–54)
AST: 23 U/L (ref 15–41)
Albumin: 3.5 g/dL (ref 3.5–5.0)
Anion gap: 13 (ref 5–15)
BUN: 10 mg/dL (ref 6–20)
CALCIUM: 8.6 mg/dL — AB (ref 8.9–10.3)
CHLORIDE: 104 mmol/L (ref 101–111)
CO2: 18 mmol/L — ABNORMAL LOW (ref 22–32)
CREATININE: 0.72 mg/dL (ref 0.44–1.00)
Glucose, Bld: 169 mg/dL — ABNORMAL HIGH (ref 65–99)
Potassium: 3.1 mmol/L — ABNORMAL LOW (ref 3.5–5.1)
Sodium: 135 mmol/L (ref 135–145)
Total Bilirubin: 1.1 mg/dL (ref 0.3–1.2)
Total Protein: 6.7 g/dL (ref 6.5–8.1)

## 2015-12-31 LAB — CBC
HCT: 41.2 % (ref 36.0–46.0)
HEMOGLOBIN: 13.7 g/dL (ref 12.0–15.0)
MCH: 29.8 pg (ref 26.0–34.0)
MCHC: 33.3 g/dL (ref 30.0–36.0)
MCV: 89.6 fL (ref 78.0–100.0)
Platelets: 276 10*3/uL (ref 150–400)
RBC: 4.6 MIL/uL (ref 3.87–5.11)
RDW: 12.9 % (ref 11.5–15.5)
WBC: 11.3 10*3/uL — AB (ref 4.0–10.5)

## 2015-12-31 LAB — MRSA PCR SCREENING: MRSA BY PCR: NEGATIVE

## 2015-12-31 LAB — POCT I-STAT, CHEM 8
BUN: 10 mg/dL (ref 6–20)
CALCIUM ION: 1.05 mmol/L — AB (ref 1.13–1.30)
CREATININE: 0.5 mg/dL (ref 0.44–1.00)
Chloride: 102 mmol/L (ref 101–111)
Glucose, Bld: 177 mg/dL — ABNORMAL HIGH (ref 65–99)
HCT: 41 % (ref 36.0–46.0)
Hemoglobin: 13.9 g/dL (ref 12.0–15.0)
Potassium: 3.2 mmol/L — ABNORMAL LOW (ref 3.5–5.1)
Sodium: 136 mmol/L (ref 135–145)
TCO2: 19 mmol/L (ref 0–100)

## 2015-12-31 LAB — CK TOTAL AND CKMB (NOT AT ARMC)
CK, MB: 1.3 ng/mL (ref 0.5–5.0)
RELATIVE INDEX: INVALID (ref 0.0–2.5)
Total CK: 53 U/L (ref 38–234)

## 2015-12-31 LAB — PROTIME-INR
INR: 1.09
PROTHROMBIN TIME: 14.2 s (ref 11.4–15.2)

## 2015-12-31 LAB — APTT: APTT: 27 s (ref 24–36)

## 2015-12-31 LAB — TROPONIN I
Troponin I: 2.35 ng/mL (ref <0.03)
Troponin I: 4.36 ng/mL (ref <0.03)

## 2015-12-31 LAB — POCT ACTIVATED CLOTTING TIME: Activated Clotting Time: 472 seconds

## 2015-12-31 SURGERY — LEFT HEART CATH AND CORONARY ANGIOGRAPHY

## 2015-12-31 MED ORDER — LIDOCAINE HCL (PF) 1 % IJ SOLN
INTRAMUSCULAR | Status: DC | PRN
  Administered 2015-12-31: 25 mL

## 2015-12-31 MED ORDER — HEPARIN SODIUM (PORCINE) 1000 UNIT/ML IJ SOLN
INTRAMUSCULAR | Status: AC
  Filled 2015-12-31: qty 1

## 2015-12-31 MED ORDER — ASPIRIN 81 MG PO CHEW
81.0000 mg | CHEWABLE_TABLET | Freq: Every day | ORAL | Status: DC
  Administered 2016-01-01 – 2016-01-02 (×2): 81 mg via ORAL
  Filled 2015-12-31 (×2): qty 1

## 2015-12-31 MED ORDER — IOPAMIDOL (ISOVUE-370) INJECTION 76%
INTRAVENOUS | Status: AC
  Filled 2015-12-31: qty 50

## 2015-12-31 MED ORDER — HYDRALAZINE HCL 20 MG/ML IJ SOLN: 10.0000 mg | Freq: Once | INTRAMUSCULAR | Status: DC

## 2015-12-31 MED ORDER — NORETHIN ACE-ETH ESTRAD-FE 1-20 MG-MCG(24) PO TABS: 1.0000 | ORAL_TABLET | Freq: Every day | ORAL | Status: DC

## 2015-12-31 MED ORDER — HYDROMORPHONE HCL 1 MG/ML IJ SOLN
INTRAMUSCULAR | Status: DC | PRN
  Administered 2015-12-31: 0.5 mg via INTRAVENOUS

## 2015-12-31 MED ORDER — SODIUM CHLORIDE 0.9% FLUSH: 3.0000 mL | INTRAVENOUS | Status: DC | PRN

## 2015-12-31 MED ORDER — ONDANSETRON HCL 4 MG/2ML IJ SOLN: 4.0000 mg | Freq: Four times a day (QID) | INTRAMUSCULAR | Status: DC | PRN

## 2015-12-31 MED ORDER — MIDAZOLAM HCL 2 MG/2ML IJ SOLN
INTRAMUSCULAR | Status: AC
  Filled 2015-12-31: qty 2

## 2015-12-31 MED ORDER — ALPRAZOLAM 0.25 MG PO TABS: 0.2500 mg | ORAL_TABLET | Freq: Two times a day (BID) | ORAL | Status: DC | PRN

## 2015-12-31 MED ORDER — HYDROCHLOROTHIAZIDE 12.5 MG PO CAPS
12.5000 mg | ORAL_CAPSULE | Freq: Every day | ORAL | Status: DC
  Administered 2016-01-01 – 2016-01-02 (×2): 12.5 mg via ORAL
  Filled 2015-12-31 (×2): qty 1

## 2015-12-31 MED ORDER — NITROGLYCERIN 1 MG/10 ML FOR IR/CATH LAB
INTRA_ARTERIAL | Status: DC | PRN
  Administered 2015-12-31: 200 ug via INTRACORONARY
  Administered 2015-12-31: 150 ug via INTRACORONARY
  Administered 2015-12-31 (×3): 200 ug via INTRACORONARY

## 2015-12-31 MED ORDER — TICAGRELOR 90 MG PO TABS
ORAL_TABLET | ORAL | Status: AC
  Filled 2015-12-31: qty 2

## 2015-12-31 MED ORDER — ATORVASTATIN CALCIUM 80 MG PO TABS
80.0000 mg | ORAL_TABLET | Freq: Every day | ORAL | Status: DC
  Administered 2015-12-31 – 2016-01-01 (×2): 80 mg via ORAL
  Filled 2015-12-31 (×2): qty 1

## 2015-12-31 MED ORDER — SODIUM CHLORIDE 0.9% FLUSH
3.0000 mL | Freq: Two times a day (BID) | INTRAVENOUS | Status: DC
  Administered 2015-12-31 – 2016-01-01 (×4): 3 mL via INTRAVENOUS

## 2015-12-31 MED ORDER — HEPARIN SODIUM (PORCINE) 1000 UNIT/ML IJ SOLN
INTRAMUSCULAR | Status: DC | PRN
  Administered 2015-12-31: 5000 [IU] via INTRAVENOUS

## 2015-12-31 MED ORDER — SODIUM CHLORIDE 0.9 % IV SOLN
INTRAVENOUS | Status: DC | PRN
  Administered 2015-12-31: 1.75 mg/kg/h via INTRAVENOUS

## 2015-12-31 MED ORDER — NITROGLYCERIN 1 MG/10 ML FOR IR/CATH LAB
INTRA_ARTERIAL | Status: AC
  Filled 2015-12-31: qty 10

## 2015-12-31 MED ORDER — FLUTICASONE PROPIONATE 50 MCG/ACT NA SUSP: 1.0000 | Freq: Every day | NASAL | Status: DC

## 2015-12-31 MED ORDER — SODIUM CHLORIDE 0.9 % WEIGHT BASED INFUSION
3.0000 mL/kg/h | INTRAVENOUS | Status: AC
  Administered 2015-12-31: 3 mL/kg/h via INTRAVENOUS

## 2015-12-31 MED ORDER — BIVALIRUDIN 250 MG IV SOLR
INTRAVENOUS | Status: AC
  Filled 2015-12-31: qty 250

## 2015-12-31 MED ORDER — LOSARTAN POTASSIUM 25 MG PO TABS: 25.0000 mg | ORAL_TABLET | Freq: Every day | ORAL | Status: DC

## 2015-12-31 MED ORDER — IOPAMIDOL (ISOVUE-370) INJECTION 76%
INTRAVENOUS | Status: AC
  Filled 2015-12-31: qty 125

## 2015-12-31 MED ORDER — BIVALIRUDIN BOLUS VIA INFUSION - CUPID
INTRAVENOUS | Status: DC | PRN
  Administered 2015-12-31: 46.95 mg via INTRAVENOUS

## 2015-12-31 MED ORDER — SODIUM CHLORIDE 0.9 % IV SOLN
INTRAVENOUS | Status: DC | PRN
  Administered 2015-12-31: 100 mL/h via INTRAVENOUS

## 2015-12-31 MED ORDER — IOPAMIDOL (ISOVUE-370) INJECTION 76%
INTRAVENOUS | Status: DC | PRN
  Administered 2015-12-31: 185 mL via INTRA_ARTERIAL

## 2015-12-31 MED ORDER — SODIUM CHLORIDE 0.9 % IV SOLN: 250.0000 mL | INTRAVENOUS | Status: DC | PRN

## 2015-12-31 MED ORDER — POTASSIUM CHLORIDE CRYS ER 20 MEQ PO TBCR
40.0000 meq | EXTENDED_RELEASE_TABLET | Freq: Once | ORAL | Status: AC
  Administered 2015-12-31: 40 meq via ORAL
  Filled 2015-12-31: qty 2

## 2015-12-31 MED ORDER — ACETAMINOPHEN 325 MG PO TABS: 650.0000 mg | ORAL_TABLET | ORAL | Status: DC | PRN

## 2015-12-31 MED ORDER — VERAPAMIL HCL 2.5 MG/ML IV SOLN
INTRAVENOUS | Status: AC
  Filled 2015-12-31: qty 2

## 2015-12-31 MED ORDER — MIDAZOLAM HCL 2 MG/2ML IJ SOLN
INTRAMUSCULAR | Status: DC | PRN
  Administered 2015-12-31: 2 mg via INTRAVENOUS

## 2015-12-31 MED ORDER — LOSARTAN POTASSIUM 50 MG PO TABS
100.0000 mg | ORAL_TABLET | Freq: Every day | ORAL | Status: DC
  Administered 2016-01-01 – 2016-01-02 (×2): 100 mg via ORAL
  Filled 2015-12-31 (×2): qty 2

## 2015-12-31 MED ORDER — HEPARIN (PORCINE) IN NACL 2-0.9 UNIT/ML-% IJ SOLN
INTRAMUSCULAR | Status: AC
  Filled 2015-12-31: qty 1500

## 2015-12-31 MED ORDER — LIDOCAINE HCL (PF) 1 % IJ SOLN
INTRAMUSCULAR | Status: AC
  Filled 2015-12-31: qty 30

## 2015-12-31 MED ORDER — TICAGRELOR 90 MG PO TABS
90.0000 mg | ORAL_TABLET | Freq: Two times a day (BID) | ORAL | Status: DC
  Administered 2015-12-31 – 2016-01-02 (×4): 90 mg via ORAL
  Filled 2015-12-31 (×4): qty 1

## 2015-12-31 MED ORDER — TICAGRELOR 90 MG PO TABS
ORAL_TABLET | ORAL | Status: DC | PRN
  Administered 2015-12-31: 180 mg via ORAL

## 2015-12-31 MED ORDER — MONTELUKAST SODIUM 10 MG PO TABS
10.0000 mg | ORAL_TABLET | Freq: Every day | ORAL | Status: DC
  Administered 2015-12-31 – 2016-01-01 (×2): 10 mg via ORAL
  Filled 2015-12-31 (×2): qty 1

## 2015-12-31 MED ORDER — HYDROMORPHONE HCL 1 MG/ML IJ SOLN
INTRAMUSCULAR | Status: AC
  Filled 2015-12-31: qty 1

## 2015-12-31 MED ORDER — DIAZEPAM 5 MG PO TABS: 10.0000 mg | ORAL_TABLET | ORAL | Status: DC | PRN

## 2015-12-31 MED ORDER — CARVEDILOL 6.25 MG PO TABS
6.2500 mg | ORAL_TABLET | Freq: Two times a day (BID) | ORAL | Status: DC
  Administered 2015-12-31 – 2016-01-02 (×4): 6.25 mg via ORAL
  Filled 2015-12-31 (×4): qty 1

## 2015-12-31 SURGICAL SUPPLY — 19 items
BALLN EMERGE MR 2.5X15 (BALLOONS) ×2
BALLOON EMERGE MR 2.5X15 (BALLOONS) ×1 IMPLANT
CATH INFINITI 5FR MPB2 (CATHETERS) ×2 IMPLANT
CATH OPTITORQUE JACKY 4.0 5F (CATHETERS) ×2 IMPLANT
CATH SITESEER 5F NTR (CATHETERS) ×2 IMPLANT
CATH VISTA GUIDE 6FR XBLAD3.5 (CATHETERS) ×2 IMPLANT
ELECT DEFIB PAD ADLT CADENCE (PAD) ×2 IMPLANT
GLIDESHEATH SLEND A-KIT 6F 20G (SHEATH) IMPLANT
KIT ENCORE 26 ADVANTAGE (KITS) ×2 IMPLANT
KIT HEART LEFT (KITS) ×2 IMPLANT
PACK CARDIAC CATHETERIZATION (CUSTOM PROCEDURE TRAY) ×2 IMPLANT
SHEATH PINNACLE 6F 10CM (SHEATH) ×2 IMPLANT
STENT RESOLUTE INTEG 3.5X12 (Permanent Stent) ×4 IMPLANT
STENT RESOLUTE INTEG 3.5X18 (Permanent Stent) ×2 IMPLANT
TRANSDUCER W/STOPCOCK (MISCELLANEOUS) ×2 IMPLANT
TUBING CIL FLEX 10 FLL-RA (TUBING) ×2 IMPLANT
WIRE COUGAR XT STRL 190CM (WIRE) ×2 IMPLANT
WIRE EMERALD 3MM-J .035X150CM (WIRE) ×2 IMPLANT
WIRE SAFE-T 1.5MM-J .035X260CM (WIRE) ×2 IMPLANT

## 2015-12-31 NOTE — Care Management Note (Signed)
Case Management Note  Patient Details  Name: Nimue Poynter MRN: [id number] Date of Birth: [birth year]  Subjective/Objective:     Adm w mi               Action/Plan: lives  At home   Expected Discharge Date:                  Expected Discharge Plan:  Home/Self Care  In-House Referral:     Discharge planning Services  CM Consult, Medication Assistance  Post Acute Care Choice:    Choice offered to:     DME Arranged:    DME Agency:     HH Arranged:    HH Agency:     Status of Service:     If discussed at [employer], dates discussed:    Additional Comments: gave pt 30day free and copay card for brilinta. Has [employer] ins.  Hanley Hays, RN 12/31/2015, 1:49 PM
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
## 2015-12-31 NOTE — Progress Notes (Signed)
Angiomax stopper per protocol. Will continue to monitor.

## 2015-12-31 NOTE — Progress Notes (Signed)
34fr sheath aspirated and removed from rfa, manual pressure applied for 20 minutes. Groin level 0, tegaderm dressing applied. Bedrest instructions given. Bilateral dp and pt pulses easily palpable.   Bedrest begins at 14:50:00

## 2016-01-01 ENCOUNTER — Inpatient Hospital Stay (HOSPITAL_COMMUNITY): Payer: BLUE CROSS/BLUE SHIELD

## 2016-01-01 LAB — CBC
HCT: 39.3 % (ref 36.0–46.0)
Hemoglobin: 13.3 g/dL (ref 12.0–15.0)
MCH: 31.4 pg (ref 26.0–34.0)
MCHC: 33.8 g/dL (ref 30.0–36.0)
MCV: 92.7 fL (ref 78.0–100.0)
Platelets: 207 K/uL (ref 150–400)
RBC: 4.24 MIL/uL (ref 3.87–5.11)
RDW: 13.2 % (ref 11.5–15.5)
WBC: 11.7 K/uL — ABNORMAL HIGH (ref 4.0–10.5)

## 2016-01-01 LAB — HEMOGLOBIN A1C
Hgb A1c MFr Bld: 5.4 % (ref 4.8–5.6)
MEAN PLASMA GLUCOSE: 108 mg/dL

## 2016-01-01 LAB — BASIC METABOLIC PANEL WITH GFR
Anion gap: 5 (ref 5–15)
BUN: 6 mg/dL (ref 6–20)
CO2: 24 mmol/L (ref 22–32)
Calcium: 8.3 mg/dL — ABNORMAL LOW (ref 8.9–10.3)
Chloride: 107 mmol/L (ref 101–111)
Creatinine, Ser: 0.66 mg/dL (ref 0.44–1.00)
GFR calc Af Amer: >60 mL/min
GFR calc non Af Amer: >60 mL/min
Glucose, Bld: 98 mg/dL (ref 65–99)
Potassium: 3.9 mmol/L (ref 3.5–5.1)
Sodium: 136 mmol/L (ref 135–145)

## 2016-01-01 LAB — TROPONIN I: Troponin I: 2.62 ng/mL

## 2016-01-01 NOTE — Progress Notes (Signed)
CARDIAC REHAB PHASE I   PRE:  Rate/Rhythm: Sinus 93  BP:  Supine: 120/82      SaO2: 98% Room Air  MODE:  Ambulation: 600 ft   POST:  Rate/Rhythem: Sinus 94  BP:    Sitting: 122/90     SaO2: 97% Room Air  [name] ambulated in the hallway independently  without pain or difficulty. Patient given MI booklet. Reviewed exercise instructions. Heart healthy diet. Discussed use of sublingual nitroglycerin and when to call 911 and the importance of taking brillinta as prescribed.[name] agreed to be contacted at home about phase 2 cardiac rehab. Patient assisted back to bed with call light within.  Thayer Headings RN BSN

## 2016-01-01 NOTE — Progress Notes (Addendum)
Subjective:  Feeling well, ambulating in room, reports chest soreness but no exertional pain.   Objective:  Vital Signs in the last 24 hours: Temp:  [97.6 F (36.4 C)-98.8 F (37.1 C)] 98.3 F (36.8 C) (08/26 0751) Pulse Rate:  [0-98] 87 (08/26 0400) Resp:  [0-33] 22 (08/26 0400) BP: (115-164)/(63-98) 121/95 (08/26 0400) SpO2:  [0 %-100 %] 98 % (08/26 0400) Arterial Line BP: (136-160)/(76-92) 160/83 (08/25 1429) Weight:  [63.7 kg (140 lb 6.9 oz)] 63.7 kg (140 lb 6.9 oz) (08/25 1133)  Intake/Output from previous day: 08/25 0701 - 08/26 0700 In: 1364.4 [P.O.:600; I.V.:764.4] Out: 425 [Urine:425]  Physical Exam: General appearance: alert, cooperative, appears stated age and mild distress Eyes: negative Neck: no adenopathy, no carotid bruit, no JVD, supple, symmetrical, trachea midline and thyroid not enlarged, symmetric, no tenderness/mass/nodules Resp: clear to auscultation bilaterally Chest wall: no tenderness Cardio: regular rate and rhythm, S1, S2 normal, no murmur, click, rub or gallop GI: soft, non-tender; bowel sounds normal; no masses,  no organomegaly Extremities: extremities normal, atraumatic, no cyanosis or edema Pulses: 2+ and symmetric Skin: Skin color, texture, turgor normal. No rashes or lesions Neurologic: Grossly normal   Lab Results: BMP  Recent Labs  02/02/15 0934 12/31/15 1001 01/01/16 0249  NA 141 136  135 136  K 5.2* 3.2*  3.1* 3.9  CL 106 102  104 107  CO2 26 18* 24  GLUCOSE 99 177*  169* 98  BUN 13 10  10 6   CREATININE 0.69 0.50  0.72 0.66  CALCIUM 9.2 8.6* 8.3*  GFRNONAA  --  >60 >60  GFRAA  --  >60 >60    CBC  Recent Labs Lab 01/01/16 0249  WBC 11.7*  RBC 4.24  HGB 13.3  HCT 39.3  PLT 207  MCV 92.7  MCH 31.4  MCHC 33.8  RDW 13.2    HEMOGLOBIN A1C Lab Results  Component Value Date   HGBA1C 5.4 12/31/2015   MPG 108 12/31/2015    Cardiac Panel (last 3 results)  Recent Labs  12/31/15 1001 12/31/15 1310  12/31/15 1901 01/01/16 0249  CKTOTAL 53  --   --   --   CKMB 1.3  --   --   --   TROPONINI  --  2.35* 4.36* 2.62*  RELINDX RELATIVE INDEX IS INVALID  --   --   --     BNP (last 3 results) No results for input(s): PROBNP in the last 8760 hours.  TSH  Recent Labs  02/02/15 0934  TSH 2.47    CHOLESTEROL Lipid Panel     Component Value Date/Time   CHOL 171 12/31/2015 1001   TRIG 76 12/31/2015 1001   HDL 62 12/31/2015 1001   CHOLHDL 2.8 12/31/2015 1001   VLDL 15 12/31/2015 1001   LDLCALC 94 12/31/2015 1001    Recent Labs  02/02/15 0934 12/31/15 1001  CHOL 181 171    Hepatic Function Panel  Recent Labs  02/02/15 0934 12/31/15 1001  PROT 7.1 6.7  ALBUMIN 4.1 3.5  AST 18 23  ALT 17 19  ALKPHOS 31* 31*  BILITOT 0.7 1.1    Imaging: No results found.  Cardiac Studies: EKG 12/29/2015: sinus rhythm at a rate of 85 bpm, normal axis, anterolateral T wave inversions, cannot exclude ischemia, likely completed infarct. QT/QTc 436/518 ms.  EKG 12/31/2015 at 1105: Sinus rhythm at a rate of 74 bpm, normal axis, normal intervals, no evidence of ischemia.  Compared to prior EKG, inferior ST depression  and anterolateral ST elevation has resolved.  EKG 12/31/2015 at 0920: Sinus rhythm at a rate of 61 bpm, normal axis, normal intervals, anteroseptal ST elevation with reciprocal ST depression in the inferior leads.  Echocardiogram: pending  Assessment/Plan:  1. Acute Anterolateral MI 2. Ischemic cardiomyopathy 3. Benign Essential Hypertension 4. Family history of premature CAD (father had MI in 45s)  Recommendation: Feeling well, reports chest soreness but denies pain with ambulation. No shortness of breath. Echo pending. Will transfer to telemetry.   Erling Conte, NP-C 01/01/2016, 8:06 AM [facility] Pager: [phone] Office: [phone]

## 2016-01-01 NOTE — Progress Notes (Signed)
*  PRELIMINARY RESULTS* Echocardiogram 2D Echocardiogram has been performed.  Alexandra Swanson Section 01/01/2016, 11:07 AM

## 2016-01-02 ENCOUNTER — Observation Stay (HOSPITAL_COMMUNITY)
Admission: EM | Admit: 2016-01-02 | Discharge: 2016-01-03 | Disposition: A | Discharge status: Home / Self Care | Payer: BLUE CROSS/BLUE SHIELD | Attending: Cardiology | Admitting: Cardiology

## 2016-01-02 ENCOUNTER — Encounter (HOSPITAL_COMMUNITY): Payer: Self-pay | Admitting: Emergency Medicine

## 2016-01-02 ENCOUNTER — Other Ambulatory Visit: Payer: Self-pay | Admitting: Cardiology

## 2016-01-02 ENCOUNTER — Observation Stay (HOSPITAL_COMMUNITY): Payer: BLUE CROSS/BLUE SHIELD

## 2016-01-02 DIAGNOSIS — R55 Syncope and collapse: Principal | ICD-10-CM | POA: Diagnosis present

## 2016-01-02 DIAGNOSIS — Z7982 Long term (current) use of aspirin: Secondary | ICD-10-CM | POA: Insufficient documentation

## 2016-01-02 DIAGNOSIS — I10 Essential (primary) hypertension: Secondary | ICD-10-CM | POA: Insufficient documentation

## 2016-01-02 DIAGNOSIS — Z79899 Other long term (current) drug therapy: Secondary | ICD-10-CM | POA: Insufficient documentation

## 2016-01-02 DIAGNOSIS — Z955 Presence of coronary angioplasty implant and graft: Secondary | ICD-10-CM | POA: Insufficient documentation

## 2016-01-02 LAB — CBC
HEMATOCRIT: 40.4 % (ref 36.0–46.0)
HEMOGLOBIN: 13.1 g/dL (ref 12.0–15.0)
MCH: 29.8 pg (ref 26.0–34.0)
MCHC: 32.4 g/dL (ref 30.0–36.0)
MCV: 91.8 fL (ref 78.0–100.0)
PLATELETS: 204 10*3/uL (ref 150–400)
RBC: 4.4 MIL/uL (ref 3.87–5.11)
RDW: 13.1 % (ref 11.5–15.5)
WBC: 12.3 10*3/uL — AB (ref 4.0–10.5)

## 2016-01-02 LAB — ECHOCARDIOGRAM COMPLETE
HEIGHTINCHES: 67 in
Weight: 2246.93 oz

## 2016-01-02 LAB — CREATININE, SERUM
Creatinine, Ser: 0.58 mg/dL (ref 0.44–1.00)
GFR calc non Af Amer: >60 mL/min (ref >60)

## 2016-01-02 MED ORDER — CARVEDILOL 12.5 MG PO TABS: 12.5000 mg | ORAL_TABLET | Freq: Two times a day (BID) | ORAL | 1 refills | Status: DC

## 2016-01-02 MED ORDER — ALPRAZOLAM 0.25 MG PO TABS
0.2500 mg | ORAL_TABLET | Freq: Two times a day (BID) | ORAL | Status: DC | PRN
  Administered 2016-01-02: 0.25 mg via ORAL
  Filled 2016-01-02: qty 1

## 2016-01-02 MED ORDER — FLUTICASONE PROPIONATE 50 MCG/ACT NA SUSP
2.0000 | Freq: Every day | NASAL | Status: DC | PRN
  Filled 2016-01-02: qty 16

## 2016-01-02 MED ORDER — NORETHIN ACE-ETH ESTRAD-FE 1.5-30 MG-MCG PO TABS: 1.0000 | ORAL_TABLET | Freq: Every day | ORAL | 11 refills | Status: DC

## 2016-01-02 MED ORDER — CARVEDILOL 6.25 MG PO TABS
6.2500 mg | ORAL_TABLET | Freq: Once | ORAL | Status: AC
  Administered 2016-01-02: 6.25 mg via ORAL
  Filled 2016-01-02: qty 1

## 2016-01-02 MED ORDER — ACETAMINOPHEN 325 MG PO TABS
650.0000 mg | ORAL_TABLET | ORAL | Status: DC | PRN
  Administered 2016-01-02: 650 mg via ORAL
  Filled 2016-01-02: qty 2

## 2016-01-02 MED ORDER — ONDANSETRON HCL 4 MG/2ML IJ SOLN: 4.0000 mg | Freq: Four times a day (QID) | INTRAMUSCULAR | Status: DC | PRN

## 2016-01-02 MED ORDER — HEPARIN SODIUM (PORCINE) 5000 UNIT/ML IJ SOLN
5000.0000 [IU] | Freq: Three times a day (TID) | INTRAMUSCULAR | Status: DC
  Filled 2016-01-02: qty 1

## 2016-01-02 MED ORDER — TICAGRELOR 90 MG PO TABS
90.0000 mg | ORAL_TABLET | Freq: Two times a day (BID) | ORAL | Status: DC
  Administered 2016-01-02 – 2016-01-03 (×2): 90 mg via ORAL
  Filled 2016-01-02 (×2): qty 1

## 2016-01-02 MED ORDER — LOSARTAN POTASSIUM 50 MG PO TABS
50.0000 mg | ORAL_TABLET | Freq: Every day | ORAL | Status: DC
  Administered 2016-01-03: 50 mg via ORAL
  Filled 2016-01-02: qty 1

## 2016-01-02 MED ORDER — TICAGRELOR 90 MG PO TABS: 90.0000 mg | ORAL_TABLET | Freq: Two times a day (BID) | ORAL | 1 refills | Status: DC

## 2016-01-02 MED ORDER — ATORVASTATIN CALCIUM 80 MG PO TABS: 80.0000 mg | ORAL_TABLET | Freq: Every day | ORAL | 1 refills | Status: DC

## 2016-01-02 MED ORDER — ATORVASTATIN CALCIUM 80 MG PO TABS
80.0000 mg | ORAL_TABLET | Freq: Every day | ORAL | Status: DC
  Administered 2016-01-02: 80 mg via ORAL
  Filled 2016-01-02: qty 1

## 2016-01-02 MED ORDER — TICAGRELOR 90 MG PO TABS: 90.0000 mg | ORAL_TABLET | Freq: Two times a day (BID) | ORAL | 0 refills | Status: DC

## 2016-01-02 MED ORDER — ASPIRIN EC 81 MG PO TBEC
81.0000 mg | DELAYED_RELEASE_TABLET | Freq: Every day | ORAL | Status: DC
  Administered 2016-01-03: 81 mg via ORAL
  Filled 2016-01-02: qty 1

## 2016-01-02 MED ORDER — LORATADINE 10 MG PO TABS: 10.0000 mg | ORAL_TABLET | Freq: Every day | ORAL | Status: DC

## 2016-01-02 MED ORDER — CARVEDILOL 6.25 MG PO TABS
6.2500 mg | ORAL_TABLET | Freq: Two times a day (BID) | ORAL | Status: DC
  Administered 2016-01-02 – 2016-01-03 (×2): 6.25 mg via ORAL
  Filled 2016-01-02 (×2): qty 1

## 2016-01-02 NOTE — Discharge Instructions (Signed)
Coronary Angiogram With Stent, Care After °Refer to this sheet in the next few weeks. These instructions provide you with information about caring for yourself after your procedure. Your health care provider may also give you more specific instructions. Your treatment has been planned according to current medical practices, but problems sometimes occur. Call your health care provider if you have any problems or questions after your procedure. °WHAT TO EXPECT AFTER THE PROCEDURE  °After your procedure, it is typical to have the following: °· Bruising at the catheter insertion site that usually fades within 1-2 weeks. °· Blood collecting in the tissue (hematoma) that may be painful to the touch. It should usually decrease in size and tenderness within 1-2 weeks. °HOME CARE INSTRUCTIONS °· Take medicines only as directed by your health care provider. Blood thinners may be prescribed after your procedure to improve blood flow through the stent. °· You may shower 24-48 hours after the procedure or as directed by your health care provider. Remove the bandage (dressing) and gently wash the catheter insertion site with plain soap and water. Pat the area dry with a clean towel. Do not rub the site, because this may cause bleeding. °· Do not take baths, swim, or use a hot tub until your health care provider approves. °· Check your catheter insertion site every day for redness, swelling, or drainage. °· Do not apply powder or lotion to the site. °· Do not lift over 10 lb (4.5 kg) for 5 days after your procedure or as directed by your health care provider. °· Ask your health care provider when it is okay to: °¨ Return to work or school. °¨ Resume usual physical activities or sports. °¨ Resume sexual activity. °· Eat a heart-healthy diet. This should include plenty of fresh fruits and vegetables. Meat should be lean cuts. Avoid the following types of food: °¨ Food that is high in salt. °¨ Canned or highly processed food. °¨ Food  that is high in saturated fat or sugar. °¨ Fried food. °· Make any other lifestyle changes as recommended by your health care provider. These may include: °¨ Not using any tobacco products, including cigarettes, chewing tobacco, or electronic cigarettes. If you need help quitting, ask your health care provider. °¨ Managing your weight. °¨ Getting regular exercise. °¨ Managing your blood pressure. °¨ Limiting your alcohol intake. °¨ Managing other health problems, such as diabetes. °· If you need an MRI after your heart stent has been placed, be sure to tell the health care provider who orders the MRI that you have a heart stent. °· Keep all follow-up visits as directed by your health care provider. This is important. °SEEK MEDICAL CARE IF: °· You have a fever. °· You have chills. °· You have increased bleeding from the catheter insertion site. Hold pressure on the site. °SEEK IMMEDIATE MEDICAL CARE IF: °· You develop chest pain or shortness of breath, feel faint, or pass out. °· You have unusual pain at the catheter insertion site. °· You have redness, warmth, or swelling at the catheter insertion site. °· You have drainage (other than a small amount of blood on the dressing) from the catheter insertion site. °· The catheter insertion site is bleeding, and the bleeding does not stop after 30 minutes of holding steady pressure on the site. °· You develop bleeding from any other place, such as from your rectum. There may be bright red blood in your urine or stool, or it may appear as black, tarry stool. °  °  This information is not intended to replace advice given to you by your health care provider. Make sure you discuss any questions you have with your health care provider.   Document Released: 11/11/2004 Document Revised: 05/15/2014 Document Reviewed: 09/16/2012 Elsevier Interactive Patient Education 2016 [employer].  Sexual Activity and Heart Disease Sexual intimacy is an important part of your well-being.  After a cardiac event, you may be worried about having sex. Most people can continue to have an active sex life after a cardiac event. If you or your partner have any questions about sexual activity, be sure to talk to your health care provider.  WHEN CAN YOU HAVE SEX AFTER A CARDIAC EVENT?  Resuming sexual activity (intercourse, masturbation, or oral sex) depends on the type of cardiac event you had. If you had a heart attack, it may be okay to have sex after 10 days. If you had a complicated cardiac event or heart surgery, you may not be able to have sex for as long as 8 weeks. Talk to your health care provider about safely resuming sex after your cardiac event. HOW DO YOU KNOW IF YOU ARE READY TO RETURN TO SEXUAL ACTIVITY? Returning to sexual activity depends on your physical comfort, mental readiness, and previous sexual habits. Physically, sexual activity is no more work than climbing two flights of stairs or walking briskly for 20 minutes. If you can do these activities without having angina, having shortness of breath, or becoming overly tired, it is okay to have sex. WHAT DO I NEED TO KNOW ABOUT SEXUAL ACTIVITY AFTER A CARDIAC EVENT? After a cardiac event, some prescribed medicines may affect your sexual function. These effects can include little or no desire for sex, difficulty achieving or maintaining an erection, decreased vaginal lubrication, or the inability to achieve an orgasm. If any of these happen, do not stop taking your medicine. Talk to your health care provider about sexual dysfunction problems.   Talk to your health care provider before taking herbal medicines or vitamins. These can interfere with prescribed medicines and heart function.  If you take medicine for sexual dysfunction, avoid medicine such as nitroglycerin or long-acting nitrate medicine for 24 hours. Using a sexual dysfunction drug and a nitrate medicine together can cause a serious drop in blood pressure.  If you  have angina and have taken a sexual dysfunction drug, go to your local emergency department for treatment. The emotional stress of a cardiac event can affect you and your partner's intimacy. It is important to:  Choose a relaxing atmosphere.  Talk openly and honestly with each other.  Be patient with each other.  Increase and strengthen intimacy by doing things such as caressing, touching, and holding each other. HOW DO I CARE FOR MYSELF AT HOME?  Avoid having sex after a heavy meal.  Avoid having too much alcohol before sex.  Participating in a cardiac rehabilitation program or regular exercise can benefit your sex life. It builds strength and endurance. After exercising, have sex when you feel rested and relaxed.  Ask your partner to take a more active role during sex.  Ask your health care provider when it is okay to have anal sex.  If you have angina during sex and are not on a sexual dysfunction drug, stop having sex and take a nitroglycerin as directed. If the angina goes away, you may resume sexual activity.  Always talk to your health care provider if you are experiencing sexual dysfunction, depression, or any type of  pain.   This information is not intended to replace advice given to you by your health care provider. Make sure you discuss any questions you have with your health care provider.   Document Released: 02/12/2013 Document Revised: 05/15/2014 Document Reviewed: 02/12/2013 Elsevier Interactive Patient Education [employer]
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
## 2016-01-02 NOTE — H&P (Signed)
Alexandra Swanson is an 50 y.o. female.   Chief Complaint: Syncope HPI: Alexandra Swanson  is a 50 y.o. female  With admission to hospital with acute anterolateral MI on 12/31/15, discharged home 1 hour ago, went to pharmacy and was feeling dizzy after taking Coreg but thought she would feel well if she walked outside and got out of the car and was sitting in pharmacy when she c/o feeling dizzy and thought she was going to pass out and then had syncope.  Her friend, Bridgette who is a NP felt for pulse and could not feel pulse and started CPR, but patient came back immediately and knew what was happening. EMS activated and found to be in no distress, SBP 100 mm Hg and transferred to ED.  She is asymptomatic except states her chest hurts in the middle where she had compression.  No seizure, no incontinence. Now admitted for observation.    Past Medical History:  Diagnosis Date  . Hyperlipidemia   . Hypertension     Past Surgical History:  Procedure Laterality Date  . CARDIAC CATHETERIZATION N/A 12/31/2015   Procedure: Left Heart Cath and Coronary Angiography;  Surgeon: Adrian Prows, MD;  Location: [facility];  Service: Cardiovascular;  Laterality: N/A;  . CARDIAC CATHETERIZATION N/A 12/31/2015   Procedure: Coronary Stent Intervention;  Surgeon: Adrian Prows, MD;  Location: [facility];  Service: Cardiovascular;  Laterality: N/A;  . CESAREAN SECTION      Family History  Problem Relation Age of Onset  . Hyperlipidemia Mother   . Hypertension Mother   . Heart disease Father   . Alcohol abuse Father   . Alcohol abuse Other   . Drug abuse Other   . Hyperlipidemia Other   . Hypertension Other   . Stroke Other   . Cancer Neg Hx   . COPD Neg Hx   . Diabetes Neg Hx   . Early death Neg Hx   . Hearing loss Neg Hx   . Kidney disease Neg Hx    Social History:  reports that she has never smoked. She has never used smokeless tobacco. She reports that she drinks about 7.2 oz of alcohol per  week . She reports that she does not use drugs.  Allergies:  Allergies  Allergen Reactions  . Clarithromycin Nausea Only    Review of Systems - Negative except dizziness when she stands up.  Chest pain at compresion site  Blood pressure 97/66, pulse 76, temperature 98.2 F (36.8 C), resp. rate 21, SpO2 100 %. General appearance: alert, cooperative, appears stated age and no distress Eyes: negative findings: lids and lashes normal and conjunctivae and sclerae normal Neck: no adenopathy, no carotid bruit, no JVD, supple, symmetrical, trachea midline and thyroid not enlarged, symmetric, no tenderness/mass/nodules Neck: JVP - normal, carotids 2+= without bruits Resp: clear to auscultation bilaterally Chest wall: no tenderness, minimal tenderness on sternum. No bruising. No rib displacement Cardio: regular rate and rhythm, S1, S2 normal, no murmur, click, rub or gallop GI: soft, non-tender; bowel sounds normal; no masses,  no organomegaly Extremities: extremities normal, atraumatic, no cyanosis or edema Pulses: 2+ and symmetric Skin: Skin color, texture, turgor normal. No rashes or lesions Neurologic: Grossly normal  Results for orders placed or performed during the hospital encounter of 12/31/15 (from the past 48 hour(s))  Troponin I (q 6hr x 3)     Status: Abnormal   Collection Time: 12/31/15  1:10 PM  Result Value Ref Range  Troponin I 2.35 (HH) <0.03 ng/mL    Comment: CRITICAL RESULT CALLED TO, READ BACK BY AND VERIFIED WITH: Youlanda Roys 1401 12/31/15 D BRADLEY   MRSA PCR Screening     Status: None   Collection Time: 12/31/15  1:12 PM  Result Value Ref Range   MRSA by PCR NEGATIVE NEGATIVE    Comment:        The GeneXpert MRSA Assay (FDA approved for NASAL specimens only), is one component of a comprehensive MRSA colonization surveillance program. It is not intended to diagnose MRSA infection nor to guide or monitor treatment for MRSA infections.   Troponin I (q 6hr x  3)     Status: Abnormal   Collection Time: 12/31/15  7:01 PM  Result Value Ref Range   Troponin I 4.36 (HH) <0.03 ng/mL    Comment: CRITICAL VALUE NOTED.  VALUE IS CONSISTENT WITH PREVIOUSLY REPORTED AND CALLED VALUE.  Troponin I (q 6hr x 3)     Status: Abnormal   Collection Time: 01/01/16  2:49 AM  Result Value Ref Range   Troponin I 2.62 (HH) <0.03 ng/mL    Comment: CRITICAL VALUE NOTED.  VALUE IS CONSISTENT WITH PREVIOUSLY REPORTED AND CALLED VALUE.  Basic metabolic panel     Status: Abnormal   Collection Time: 01/01/16  2:49 AM  Result Value Ref Range   Sodium 136 135 - 145 mmol/L   Potassium 3.9 3.5 - 5.1 mmol/L    Comment: DELTA CHECK NOTED   Chloride 107 101 - 111 mmol/L   CO2 24 22 - 32 mmol/L   Glucose, Bld 98 65 - 99 mg/dL   BUN 6 6 - 20 mg/dL   Creatinine, Ser 0.66 0.44 - 1.00 mg/dL   Calcium 8.3 (L) 8.9 - 10.3 mg/dL   GFR calc non Af Amer >60 >60 mL/min   GFR calc Af Amer >60 >60 mL/min    Comment: (NOTE) The eGFR has been calculated using the CKD EPI equation. This calculation has not been validated in all clinical situations. eGFR's persistently <60 mL/min signify possible Chronic Kidney Disease.    Anion gap 5 5 - 15  CBC     Status: Abnormal   Collection Time: 01/01/16  2:49 AM  Result Value Ref Range   WBC 11.7 (H) 4.0 - 10.5 K/uL   RBC 4.24 3.87 - 5.11 MIL/uL   Hemoglobin 13.3 12.0 - 15.0 g/dL   HCT 39.3 36.0 - 46.0 %   MCV 92.7 78.0 - 100.0 fL   MCH 31.4 26.0 - 34.0 pg   MCHC 33.8 30.0 - 36.0 g/dL   RDW 13.2 11.5 - 15.5 %   Platelets 207 150 - 400 K/uL   No results found.  Labs:   Lab Results  Component Value Date   WBC 11.7 (H) 01/01/2016   HGB 13.3 01/01/2016   HCT 39.3 01/01/2016   MCV 92.7 01/01/2016   PLT 207 01/01/2016    Recent Labs Lab 12/31/15 1001 01/01/16 0249  NA 136  135 136  K 3.2*  3.1* 3.9  CL 102  104 107  CO2 18* 24  BUN '10  10 6  '$ CREATININE 0.50  0.72 0.66  CALCIUM 8.6* 8.3*  PROT 6.7  --   BILITOT 1.1  --    ALKPHOS 31*  --   ALT 19  --   AST 23  --   GLUCOSE 177*  169* 98    Lipid Panel     Component Value Date/Time  CHOL 171 12/31/2015 1001   TRIG 76 12/31/2015 1001   HDL 62 12/31/2015 1001   CHOLHDL 2.8 12/31/2015 1001   VLDL 15 12/31/2015 1001   LDLCALC 94 12/31/2015 1001    BNP (last 3 results) No results for input(s): BNP in the last 8760 hours.  HEMOGLOBIN A1C Lab Results  Component Value Date   HGBA1C 5.4 12/31/2015   MPG 108 12/31/2015    Cardiac Panel (last 3 results)  Recent Labs  12/31/15 1001 12/31/15 1310 12/31/15 1901 01/01/16 0249  CKTOTAL 53  --   --   --   CKMB 1.3  --   --   --   TROPONINI  --  2.35* 4.36* 2.62*  RELINDX RELATIVE INDEX IS INVALID  --   --   --     Lab Results  Component Value Date   CKTOTAL 53 12/31/2015   CKMB 1.3 12/31/2015   TROPONINI 2.62 ([place]) 01/01/2016     TSH  Recent Labs  02/02/15 0934  TSH 2.47    EKG: NSR, anterolateral T inversion suggestive of evolution of anterior MI. No significant change from discharge EKG.  No current facility-administered medications for this encounter.   Current Outpatient Prescriptions:  .  aspirin 81 MG tablet, Take 81 mg by mouth daily. , Disp: , Rfl:  .  atorvastatin (LIPITOR) 80 MG tablet, Take 1 tablet (80 mg total) by mouth daily at 6 PM., Disp: 60 tablet, Rfl: 1 .  Biotin 1000 MCG tablet, Take 1,000 mcg by mouth daily., Disp: , Rfl:  .  carvedilol (COREG) 12.5 MG tablet, Take 1 tablet (12.5 mg total) by mouth 2 (two) times daily with a meal., Disp: 60 tablet, Rfl: 1 .  fexofenadine (ALLEGRA) 180 MG tablet, Take 180 mg by mouth daily., Disp: , Rfl:  .  fluticasone (FLONASE) 50 MCG/ACT nasal spray, Place 2 sprays into both nostrils daily as needed for allergies or rhinitis., Disp: , Rfl:  .  losartan-hydrochlorothiazide (HYZAAR) 100-12.5 MG tablet, Take 1 tablet by mouth daily., Disp: , Rfl: 2 .  montelukast (SINGULAIR) 10 MG tablet, Take 1 tablet (10 mg total) by mouth at  bedtime. (Patient taking differently: Take 10 mg by mouth daily. ), Disp: 90 tablet, Rfl: 3 .  norethindrone-ethinyl estradiol-iron (LOESTRIN FE 1.5/30) 1.5-30 MG-MCG tablet, Take 1 tablet by mouth daily., Disp: 1 Package, Rfl: 11 .  ticagrelor (BRILINTA) 90 MG TABS tablet, Take 1 tablet (90 mg total) by mouth 2 (two) times daily., Disp: 60 tablet, Rfl: 0 .  ALPRAZolam (XANAX) 0.25 MG tablet, Take 1 tablet (0.25 mg total) by mouth 2 (two) times daily as needed. for anxiety, Disp: 30 tablet, Rfl: 3  Facility-Administered Medications Ordered in Other Encounters:  .  0.9 %  sodium chloride infusion, , , Continuous PRN, Adrian Prows, MD, Last Rate: 100 mL/hr at 12/31/15 0955, 100 mL/hr at 12/31/15 0955 .  bivalirudin (ANGIOMAX) 250 mg in sodium chloride 0.9 % 50 mL (5 mg/mL) infusion, , , Continuous PRN, Adrian Prows, MD, Last Rate: 21.9 mL/hr at 12/31/15 1004, 1.75 mg/kg/hr at 12/31/15 1004 .  bivalirudin (ANGIOMAX) BOLUS via infusion, , , PRN, Adrian Prows, MD, 46.95 mg at 12/31/15 1002 .  heparin injection, , , PRN, Adrian Prows, MD, 5,000 Units at 12/31/15 1001 .  HYDROmorphone (DILAUDID) injection, , , PRN, Adrian Prows, MD, 0.5 mg at 12/31/15 0958 .  iopamidol (ISOVUE-370) 76 % injection, , , PRN, Adrian Prows, MD, 185 mL at 12/31/15 1055 .  lidocaine (  PF) (XYLOCAINE) 1 % injection, , , PRN, Adrian Prows, MD, 25 mL at 12/31/15 0959 .  midazolam (VERSED) injection, , , PRN, Adrian Prows, MD, 2 mg at 12/31/15 0957 .  nitroGLYCERIN 1 mg/10 ml (100 mcg/ml) - IR/CATH LAB, , , PRN, Adrian Prows, MD, 200 mcg at 12/31/15 1045 .  ticagrelor (BRILINTA) tablet, , , PRN, Adrian Prows, MD, 180 mg at 12/31/15 1015   Assessment/Plan Syncope: History suggestive of neurocardiogenic syncope due to low BP. Patient's BP was borderline and received increased dose Coreg this morning and was feeling dizzy sitting in the car.  CAD with recent AL MI S/P PCI to prox LAD 12/31/15.  Echo 01/01/2016: Left ventricle: The cavity size was normal. Systolic  function was   mildly to moderately reduced. The estimated ejection fraction was   in the range of 40% to 45%. Moderate hypokinesis of the   entireanteroseptal, anterior, and inferoseptal myocardium. - Aortic valve: Valve area (VTI): 2.45 cm^2. Valve area (Vmax):   2.08 cm^2. Valve area (Vmean): 1.92 cm^2. - Mitral valve: There was mild regurgitation. Family history of premature CAD Mild Hyperlipidemia.   Rec: Admit for r/o arrhythmic etiology. Will have Dr. Lovena Le see her in AM. Decrease Coreg dose to 6.25 mg BID  Decrease ARB dose. Resume antiplatelet therapy.  Adrian Prows, MD 01/02/2016, 12:34 PM [facility]. [place] Pager: [phone] Office: [phone] If no answer: Cell:  [phone]

## 2016-01-02 NOTE — ED Triage Notes (Signed)
Per EMS- pt had a MI on thursday with 3 stents placed. This morning took 12.5 of Coreg, increased from 6.5. Pt was at a store when she had a syncope, episode and was assisted to the ground. Pt was with a NP who began doing several compressions on her. Pt became alert again. Pt reports tenderness to sternum from.

## 2016-01-03 DIAGNOSIS — R55 Syncope and collapse: Secondary | ICD-10-CM | POA: Diagnosis not present

## 2016-01-03 MED ORDER — LOSARTAN POTASSIUM 50 MG PO TABS: 25.0000 mg | ORAL_TABLET | Freq: Every day | ORAL | 1 refills | Status: DC

## 2016-01-03 MED ORDER — CARVEDILOL 12.5 MG PO TABS: 6.2500 mg | ORAL_TABLET | Freq: Two times a day (BID) | ORAL | 1 refills | Status: DC

## 2016-01-03 MED FILL — Verapamil HCl IV Soln 2.5 MG/ML: INTRAVENOUS | Qty: 2 | Status: AC

## 2016-01-03 MED FILL — Heparin Sodium (Porcine) 2 Unit/ML in Sodium Chloride 0.9%: INTRAMUSCULAR | Qty: 1500 | Status: AC

## 2016-01-03 NOTE — Discharge Summary (Signed)
Physician Discharge Summary  Patient ID: Alexandra Swanson MRN: [id number] DOB/AGE: 01-03-1966 50 y.o.  Admit date: 01/02/2016 Discharge date: 8/28/20172  Primary Discharge Diagnosis Neurocardiogenic syncope 1. Acute Anterolateral MI S/P PCI to LAD Proximal to mid  LAD overlapping stents with 3.5x12, 3.5x18 and 3.5x12 Resolute DES 2. Ischemic cardiomyopathy, LVEF 40-45% 3. Benign Essential Hypertension 4. Family history of premature CAD (father had MI in 82s)  Hospital Course:  Patient was discharged on the day of admission after 48 hour stay after anterior MI, was feeling dizzy due to borderline low BP, still went to pharmacy to pick up medications and then felt dizzy and passed out momentarily. CPR performed for a few seconds with SRC.  Admitted again for 24 hours for observation to exclude ischemic etiology. No change in EKG. No PVC on tele. Ambulated without symptoms. D/W Dr. Ladona Ridgel curbside, who also felt comfortable to discharge with OP f/u.   Recommendations on discharge: Patient given strict instructions regarding taking it easy for next 2 weeks. To report any dizziness immediately to [place]. Will be seen in office in 1 week.  Discharge Exam: Blood pressure 132/82, pulse 80, temperature 99.2 F (37.3 C), temperature source Oral, resp. rate 18, height 5\' 7"  (1.702 m), weight 62.8 kg (138 lb 6.4 oz), SpO2 100 %.  General appearance: alert, cooperative, appears stated age and mild distress Eyes: negative Neck: no adenopathy, no carotid bruit, no JVD, supple, symmetrical, trachea midline and thyroid not enlarged, symmetric, no tenderness/mass/nodules Resp: clear to auscultation bilaterally Chest wall: no tenderness Cardio: regular rate and rhythm, S1, S2 normal, no murmur, click, rub or gallop GI: soft, non-tender; bowel sounds normal; no masses, no organomegaly Extremities: extremities normal, atraumatic, no cyanosis or edema Pulses: 2+ and symmetric Skin: Skin color, texture, turgor  normal. No rashes or lesions Neurologic: Grossly normal  Labs:   Lab Results  Component Value Date   WBC 12.3 (H) 01/02/2016   HGB 13.1 01/02/2016   HCT 40.4 01/02/2016   MCV 91.8 01/02/2016   PLT 204 01/02/2016    Recent Labs Lab 12/31/15 1001 01/01/16 0249 01/02/16 1338  NA 136  135 136  --   K 3.2*  3.1* 3.9  --   CL 102  104 107  --   CO2 18* 24  --   BUN 10  10 6   --   CREATININE 0.50  0.72 0.66 0.58  CALCIUM 8.6* 8.3*  --   PROT 6.7  --   --   BILITOT 1.1  --   --   ALKPHOS 31*  --   --   ALT 19  --   --   AST 23  --   --   GLUCOSE 177*  169* 98  --     Lipid Panel     Component Value Date/Time   CHOL 171 12/31/2015 1001   TRIG 76 12/31/2015 1001   HDL 62 12/31/2015 1001   CHOLHDL 2.8 12/31/2015 1001   VLDL 15 12/31/2015 1001   LDLCALC 94 12/31/2015 1001   HEMOGLOBIN A1C Lab Results  Component Value Date   HGBA1C 5.4 12/31/2015   MPG 108 12/31/2015    Cardiac Panel (last 3 results)  Recent Labs  12/31/15 1001 12/31/15 1310 12/31/15 1901 01/01/16 0249  CKTOTAL 53  --   --   --   CKMB 1.3  --   --   --   TROPONINI  --  2.35* 4.36* 2.62*  RELINDX RELATIVE INDEX IS INVALID  --   --   --  Lab Results  Component Value Date   CKTOTAL 53 12/31/2015   CKMB 1.3 12/31/2015   TROPONINI 2.62 (HH) 01/01/2016     Recent Labs  02/02/15 0934  TSH 2.47    EKG: NSR, anterolateral TWI cannnot r/o ischemia. Represents evolution of anterolateral MI. Prolonged QT. Echocardiogram 01/01/2016: preliminary: LVEF 40-45%. Severe anterolateral hypokinesis. Mild MR.  Radiology: Dg Chest 2 View  Result Date: 01/02/2016 CLINICAL DATA:  Checking for any possible damage from CPR performed on pt today after she passed out in a grocery store. Hx of HTN, casual smoker (says she doesn't smoke every day). EXAM: CHEST  2 VIEW COMPARISON:  None. FINDINGS: Normal cardiac silhouette. No pulmonary contusion, pleural fluid or pneumothorax. Fracture. IMPRESSION:  Normal chest radiograph Electronically Signed   By: Genevive Bi M.D.   On: 01/02/2016 15:51    FOLLOW UP PLANS AND APPOINTMENTS    Medication List    STOP taking these medications   losartan-hydrochlorothiazide 100-12.5 MG tablet Commonly known as:  HYZAAR     TAKE these medications   ALPRAZolam 0.25 MG tablet Commonly known as:  XANAX Take 1 tablet (0.25 mg total) by mouth 2 (two) times daily as needed. for anxiety   aspirin 81 MG tablet Take 81 mg by mouth daily.   atorvastatin 80 MG tablet Commonly known as:  LIPITOR Take 1 tablet (80 mg total) by mouth daily at 6 PM.   Biotin 1000 MCG tablet Take 1,000 mcg by mouth daily.   carvedilol 12.5 MG tablet Commonly known as:  COREG Take 0.5 tablets (6.25 mg total) by mouth 2 (two) times daily with a meal. What changed:  how much to take   fexofenadine 180 MG tablet Commonly known as:  ALLEGRA Take 180 mg by mouth daily.   fluticasone 50 MCG/ACT nasal spray Commonly known as:  FLONASE Place 2 sprays into both nostrils daily as needed for allergies or rhinitis.   losartan 50 MG tablet Commonly known as:  COZAAR Take 0.5 tablets (25 mg total) by mouth daily.   montelukast 10 MG tablet Commonly known as:  SINGULAIR Take 1 tablet (10 mg total) by mouth at bedtime. What changed:  when to take this   norethindrone-ethinyl estradiol-iron 1.5-30 MG-MCG tablet Commonly known as:  LOESTRIN FE 1.5/30 Take 1 tablet by mouth daily.   ticagrelor 90 MG Tabs tablet Commonly known as:  BRILINTA Take 1 tablet (90 mg total) by mouth 2 (two) times daily.      Follow-up Information    Yates Decamp, MD .   Specialty:  Cardiology Why:  Keep previous appointment  Contact information: [address] [place] [address] [phone]            Yates Decamp, MD 01/03/2016, 9:52 AM  Pager: [phone] Office: [phone] If no answer: [phone]

## 2016-01-03 NOTE — Discharge Instructions (Signed)
No driving for 2 weeks. Resume home activities slowly. No lifting >5Lbs.

## 2016-01-05 LAB — LIPOPROTEIN A (LPA): LIPOPROTEIN (A): 4 nmol/L (ref <75)

## 2016-02-01 ENCOUNTER — Other Ambulatory Visit: Payer: Self-pay | Admitting: Obstetrics & Gynecology

## 2016-02-01 LAB — HM MAMMOGRAPHY: HM Mammogram: NORMAL (ref 0–4)

## 2016-02-01 LAB — HM PAP SMEAR

## 2016-02-02 LAB — CYTOLOGY - PAP

## 2016-02-03 ENCOUNTER — Telehealth (HOSPITAL_COMMUNITY): Payer: Self-pay | Admitting: *Deleted

## 2016-02-22 ENCOUNTER — Encounter (HOSPITAL_COMMUNITY)
Admission: RE | Admit: 2016-02-22 | Discharge: 2016-02-22 | Disposition: A | Discharge status: Home / Self Care | Payer: BLUE CROSS/BLUE SHIELD | Source: Ambulatory Visit | Attending: Cardiology | Admitting: Cardiology

## 2016-02-22 VITALS — BP 138/60 | HR 54 | Ht 67.0 in | Wt 141.5 lb

## 2016-02-22 DIAGNOSIS — Z955 Presence of coronary angioplasty implant and graft: Secondary | ICD-10-CM | POA: Diagnosis not present

## 2016-02-22 DIAGNOSIS — I2109 ST elevation (STEMI) myocardial infarction involving other coronary artery of anterior wall: Secondary | ICD-10-CM | POA: Insufficient documentation

## 2016-02-22 DIAGNOSIS — I213 ST elevation (STEMI) myocardial infarction of unspecified site: Secondary | ICD-10-CM | POA: Diagnosis present

## 2016-02-22 NOTE — Progress Notes (Signed)
Cardiac Rehab Medication Review by a Pharmacist  Does the patient  feel that his/her medications are working for him/her?  yes  Has the patient been experiencing any side effects to the medications prescribed?  yes  Does the patient measure his/her own blood pressure or blood glucose at home?  yes   Does the patient have any problems obtaining medications due to transportation or finances?   no  Understanding of regimen: excellent Understanding of indications: good Potential of compliance: excellent    Pharmacist comments: SA is a 50 yo woman who presents to cardiac rehab without assistance. She reports some dizziness if she doesn't eat in the morning. I advised her to try and take her medications with a small snack if she is unable to take a meal. Otherwise, she has a good understanding of indications and checks her blood pressure regularly.     Hillis Range, PharmD PGY1 Pharmacy Resident Pager: [phone] 02/22/2016 2:27 PM

## 2016-02-25 NOTE — Progress Notes (Signed)
Cardiac Individual Treatment Plan  Patient Details  Name: Alexandra Swanson MRN: [id number] Date of Birth: 09-Dec-1965 Referring Provider:   Flowsheet Row CARDIAC REHAB PHASE II ORIENTATION from 02/22/2016 in [facility] CARD[facility]AC REHAB  Referring Provider  Yates Decamp, MD.      Initial Encounter Date:  Flowsheet Row CARDIAC REHAB PHASE II ORIENTATION from 02/22/2016 in [facility] CARDIAC REHAB  Date  02/22/16  Referring Provider  Yates Decamp, MD.      Visit Diagnosis: ST elevation myocardial infarction (STEMI), unspecified artery [facility])  Stented coronary artery  Patient's Home Medications on Admission:  Current Outpatient Prescriptions:  .  ALPRAZolam (XANAX) 0.25 MG tablet, Take 1 tablet (0.25 mg total) by mouth 2 (two) times daily as needed. for anxiety, Disp: 30 tablet, Rfl: 3 .  aspirin 81 MG tablet, Take 81 mg by mouth daily. , Disp: , Rfl:  .  atorvastatin (LIPITOR) 80 MG tablet, Take 1 tablet (80 mg total) by mouth daily at 6 PM., Disp: 60 tablet, Rfl: 1 .  Biotin 1000 MCG tablet, Take 1,000 mcg by mouth daily., Disp: , Rfl:  .  carvedilol (COREG) 12.5 MG tablet, Take 0.5 tablets (6.25 mg total) by mouth 2 (two) times daily with a meal. (Patient taking differently: Take 12.5 mg by mouth 2 (two) times daily with a meal. ), Disp: 60 tablet, Rfl: 1 .  fexofenadine (ALLEGRA) 180 MG tablet, Take 180 mg by mouth daily., Disp: , Rfl:  .  fluticasone (FLONASE) 50 MCG/ACT nasal spray, Place 2 sprays into both nostrils daily as needed for allergies or rhinitis., Disp: , Rfl:  .  losartan (COZAAR) 50 MG tablet, Take 0.5 tablets (25 mg total) by mouth daily., Disp: 30 tablet, Rfl: 1 .  montelukast (SINGULAIR) 10 MG tablet, Take 1 tablet (10 mg total) by mouth at bedtime. (Patient taking differently: Take 10 mg by mouth daily. ), Disp: 90 tablet, Rfl: 3 .  norethindrone-ethinyl estradiol-iron (LOESTRIN FE 1.5/30) 1.5-30 MG-MCG tablet, Take 1 tablet by mouth  daily., Disp: 1 Package, Rfl: 11 .  ticagrelor (BRILINTA) 90 MG TABS tablet, Take 1 tablet (90 mg total) by mouth 2 (two) times daily., Disp: 60 tablet, Rfl: 0 No current facility-administered medications for this encounter.   Facility-Administered Medications Ordered in Other Encounters:  .  0.9 %  sodium chloride infusion, , , Continuous PRN, Yates Decamp, MD, Last Rate: 100 mL/hr at 12/31/15 0955, 100 mL/hr at 12/31/15 0955 .  bivalirudin (ANGIOMAX) 250 mg in sodium chloride 0.9 % 50 mL (5 mg/mL) infusion, , , Continuous PRN, Yates Decamp, MD, Last Rate: 21.9 mL/hr at 12/31/15 1004, 1.75 mg/kg/hr at 12/31/15 1004 .  bivalirudin (ANGIOMAX) BOLUS via infusion, , , PRN, Yates Decamp, MD, 46.95 mg at 12/31/15 1002 .  heparin injection, , , PRN, Yates Decamp, MD, 5,000 Units at 12/31/15 1001 .  HYDROmorphone (DILAUDID) injection, , , PRN, Yates Decamp, MD, 0.5 mg at 12/31/15 0958 .  iopamidol (ISOVUE-370) 76 % injection, , , PRN, Yates Decamp, MD, 185 mL at 12/31/15 1055 .  lidocaine (PF) (XYLOCAINE) 1 % injection, , , PRN, Yates Decamp, MD, 25 mL at 12/31/15 0959 .  midazolam (VERSED) injection, , , PRN, Yates Decamp, MD, 2 mg at 12/31/15 0957 .  nitroGLYCERIN 1 mg/10 ml (100 mcg/ml) - IR/CATH LAB, , , PRN, Yates Decamp, MD, 200 mcg at 12/31/15 1045 .  ticagrelor (BRILINTA) tablet, , , PRN, Yates Decamp, MD, 180 mg at 12/31/15 1015  Past  Medical History: Past Medical History:  Diagnosis Date  . Hyperlipidemia   . Hypertension     Tobacco Use: History  Smoking Status  . Never Smoker  Smokeless Tobacco  . Never Used    Labs: Recent Review Flowsheet Data    Labs for ITP Cardiac and Pulmonary Rehab Latest Ref Rng & Units 05/06/2012 01/16/2013 01/28/2014 02/02/2015 12/31/2015   Cholestrol 0 - 200 mg/dL [id number] 045181 [id number]   LDLCALC 0 - 99 mg/dL 93 914(N104(H) 97 90 94   HDL >40 mg/dL 82.9551.40 62.1361.30 08.6565.40 78.4675.40 62   Trlycerides <150 mg/dL 962.9113.0 52.887.0 41.395.0 24.476.0 76   Hemoglobin A1c 4.8 - 5.6 % - - - - 5.4   TCO2 0 - 100 mmol/L -  - - - 19      Capillary Blood Glucose: No results found for: GLUCAP   Exercise Target Goals: Date: 02/22/16  Exercise Program Goal: Individual exercise prescription set with THRR, safety & activity barriers. Participant demonstrates ability to understand and report RPE using BORG scale, to self-measure pulse accurately, and to acknowledge the importance of the exercise prescription.  Exercise Prescription Goal: Starting with aerobic activity 30 plus minutes a day, 3 days per week for initial exercise prescription. Provide home exercise prescription and guidelines that participant acknowledges understanding prior to discharge.  Activity Barriers & Risk Stratification:     Activity Barriers & Cardiac Risk Stratification - 02/22/16 1416      Activity Barriers & Cardiac Risk Stratification   Activity Barriers None   Cardiac Risk Stratification High      6 Minute Walk:     6 Minute Walk    Row Name 02/22/16 1656         6 Minute Walk   Phase Initial     Distance 2430 feet     Walk Time 6 minutes     # of Rest Breaks 0     MPH 4.6     METS 6.25     RPE 12     VO2 Peak 21.89     Symptoms No     Resting HR 54 bpm     Resting BP 138/60     Max Ex. HR 95 bpm     Max Ex. BP 128/82     2 Minute Post BP 124/74        Initial Exercise Prescription:     Initial Exercise Prescription - 02/23/16 1400      Date of Initial Exercise RX and Referring Provider   Date 02/22/16   Referring Provider Yates DecampGanji, Jay, MD.     Treadmill   MPH 3   Grade 1   Minutes 10   METs 3.71     Bike   Level 1.8   Minutes 10   METs 6.22     NuStep   Level 4   Minutes 10   METs 3     Prescription Details   Frequency (times per week) 3   Duration Progress to 30 minutes of continuous aerobic without signs/symptoms of physical distress     Intensity   THRR 40-80% of Max Heartrate 103-144   Ratings of Perceived Exertion 11-13   Perceived Dyspnea 0-4     Progression   Progression  Continue to progress workloads to maintain intensity without signs/symptoms of physical distress.     Resistance Training   Training Prescription Yes   Weight 3lbs   Reps 10-12      Perform Capillary Blood  Glucose checks as needed.  Exercise Prescription Changes:   Exercise Comments:   Discharge Exercise Prescription (Final Exercise Prescription Changes):   Nutrition:  Target Goals: Understanding of nutrition guidelines, daily intake of sodium 1500mg , cholesterol 200mg , calories 30% from fat and 7% or less from saturated fats, daily to have 5 or more servings of fruits and vegetables.  Biometrics:     Pre Biometrics - 02/22/16 1651      Pre Biometrics   Height 5\' 7"  (1.702 m)   Weight 141 lb 8.6 oz (64.2 kg)   Waist Circumference 30.5 inches   Hip Circumference 37.5 inches   Waist to Hip Ratio 0.81 %   BMI (Calculated) 22.2   Triceps Skinfold 27 mm   % Body Fat 32.6 %   Grip Strength 31 kg   Flexibility 0 in   Single Leg Stand 30 seconds       Nutrition Therapy Plan and Nutrition Goals:   Nutrition Discharge: Nutrition Scores:   Nutrition Goals Re-Evaluation:   Psychosocial: Target Goals: Acknowledge presence or absence of depression, maximize coping skills, provide positive support system. Participant is able to verbalize types and ability to use techniques and skills needed for reducing stress and depression.  Initial Review & Psychosocial Screening:     Initial Psych Review & Screening - 02/25/16 1123      Family Dynamics   Good Support System? Yes     Barriers   Psychosocial barriers to participate in program The patient should benefit from training in stress management and relaxation.;There are no identifiable barriers or psychosocial needs.     Screening Interventions   Interventions Encouraged to exercise      Quality of Life Scores:     Quality of Life - 02/22/16 1417      Quality of Life Scores   Health/Function Pre 25.17 %    Socioeconomic Pre 26.79 %   Psych/Spiritual Pre 28 %   Family Pre 25.38 %   GLOBAL Pre 26.08 %      PHQ-9: Recent Review Flowsheet Data    There is no flowsheet data to display.      Psychosocial Evaluation and Intervention:   Psychosocial Re-Evaluation:   Vocational Rehabilitation: Provide vocational rehab assistance to qualifying candidates.   Vocational Rehab Evaluation & Intervention:     Vocational Rehab - 02/22/16 1442      Initial Vocational Rehab Evaluation & Intervention   Assessment shows need for Vocational Rehabilitation No      Education: Education Goals: Education classes will be provided on a weekly basis, covering required topics. Participant will state understanding/return demonstration of topics presented.  Learning Barriers/Preferences:     Learning Barriers/Preferences - 02/22/16 1416      Learning Barriers/Preferences   Learning Barriers None   Learning Preferences Audio      Education Topics: Count Your Pulse:  -Group instruction provided by verbal instruction, demonstration, patient participation and written materials to support subject.  Instructors address importance of being able to find your pulse and how to count your pulse when at home without a heart monitor.  Patients get hands on experience counting their pulse with staff help and individually.   Heart Attack, Angina, and Risk Factor Modification:  -Group instruction provided by verbal instruction, video, and written materials to support subject.  Instructors address signs and symptoms of angina and heart attacks.    Also discuss risk factors for heart disease and how to make changes to improve heart health risk factors.  Functional Fitness:  -Group instruction provided by verbal instruction, demonstration, patient participation, and written materials to support subject.  Instructors address safety measures for doing things around the house.  Discuss how to get up and down off  the floor, how to pick things up properly, how to safely get out of a chair without assistance, and balance training.   Meditation and Mindfulness:  -Group instruction provided by verbal instruction, patient participation, and written materials to support subject.  Instructor addresses importance of mindfulness and meditation practice to help reduce stress and improve awareness.  Instructor also leads participants through a meditation exercise.    Stretching for Flexibility and Mobility:  -Group instruction provided by verbal instruction, patient participation, and written materials to support subject.  Instructors lead participants through series of stretches that are designed to increase flexibility thus improving mobility.  These stretches are additional exercise for major muscle groups that are typically performed during regular warm up and cool down.   Hands Only CPR Anytime:  -Group instruction provided by verbal instruction, video, patient participation and written materials to support subject.  Instructors co-teach with AHA video for hands only CPR.  Participants get hands on experience with mannequins.   Nutrition I class: Heart Healthy Eating:  -Group instruction provided by PowerPoint slides, verbal discussion, and written materials to support subject matter. The instructor gives an explanation and review of the Therapeutic Lifestyle Changes diet recommendations, which includes a discussion on lipid goals, dietary fat, sodium, fiber, plant stanol/sterol esters, sugar, and the components of a well-balanced, healthy diet.   Nutrition II class: Lifestyle Skills:  -Group instruction provided by PowerPoint slides, verbal discussion, and written materials to support subject matter. The instructor gives an explanation and review of label reading, grocery shopping for heart health, heart healthy recipe modifications, and ways to make healthier choices when eating out.   Diabetes Question &  Answer:  -Group instruction provided by PowerPoint slides, verbal discussion, and written materials to support subject matter. The instructor gives an explanation and review of diabetes co-morbidities, pre- and post-prandial blood glucose goals, pre-exercise blood glucose goals, signs, symptoms, and treatment of hypoglycemia and hyperglycemia, and foot care basics.   Diabetes Blitz:  -Group instruction provided by PowerPoint slides, verbal discussion, and written materials to support subject matter. The instructor gives an explanation and review of the physiology behind type 1 and type 2 diabetes, diabetes medications and rational behind using different medications, pre- and post-prandial blood glucose recommendations and Hemoglobin A1c goals, diabetes diet, and exercise including blood glucose guidelines for exercising safely.    Portion Distortion:  -Group instruction provided by PowerPoint slides, verbal discussion, written materials, and food models to support subject matter. The instructor gives an explanation of serving size versus portion size, changes in portions sizes over the last 20 years, and what consists of a serving from each food group.   Stress Management:  -Group instruction provided by verbal instruction, video, and written materials to support subject matter.  Instructors review role of stress in heart disease and how to cope with stress positively.     Exercising on Your Own:  -Group instruction provided by verbal instruction, power point, and written materials to support subject.  Instructors discuss benefits of exercise, components of exercise, frequency and intensity of exercise, and end points for exercise.  Also discuss use of nitroglycerin and activating EMS.  Review options of places to exercise outside of rehab.  Review guidelines for sex with heart disease.   Cardiac Drugs  I:  -Group instruction provided by verbal instruction and written materials to support  subject.  Instructor reviews cardiac drug classes: antiplatelets, anticoagulants, beta blockers, and statins.  Instructor discusses reasons, side effects, and lifestyle considerations for each drug class.   Cardiac Drugs II:  -Group instruction provided by verbal instruction and written materials to support subject.  Instructor reviews cardiac drug classes: angiotensin converting enzyme inhibitors (ACE-I), angiotensin II receptor blockers (ARBs), nitrates, and calcium channel blockers.  Instructor discusses reasons, side effects, and lifestyle considerations for each drug class.   Anatomy and Physiology of the Circulatory System:  -Group instruction provided by verbal instruction, video, and written materials to support subject.  Reviews functional anatomy of heart, how it relates to various diagnoses, and what role the heart plays in the overall system.   Knowledge Questionnaire Score:     Knowledge Questionnaire Score - 02/22/16 1417      Knowledge Questionnaire Score   Pre Score 24/24      Core Components/Risk Factors/Patient Goals at Admission:     Personal Goals and Risk Factors at Admission - 02/22/16 1413      Core Components/Risk Factors/Patient Goals on Admission    Weight Management Yes;Weight Loss   Admit Weight 141 lb 8.6 oz (64.2 kg)   Goal Weight: Short Term 135 lb (61.2 kg)   Goal Weight: Long Term 130 lb (59 kg)   Expected Outcomes Short Term: Continue to assess and modify interventions until short term weight is achieved;Long Term: Adherence to nutrition and physical activity/exercise program aimed toward attainment of established weight goal;Weight Loss: Understanding of general recommendations for a balanced deficit meal plan, which promotes 1-2 lb weight loss per week and includes a negative energy balance of [phone] kcal/d   Hypertension Yes   Intervention Provide education on lifestyle modifcations including regular physical activity/exercise, weight  management, moderate sodium restriction and increased consumption of fresh fruit, vegetables, and low fat dairy, alcohol moderation, and smoking cessation.;Monitor prescription use compliance.   Expected Outcomes Short Term: Continued assessment and intervention until BP is < 140/54mm HG in hypertensive participants. < 130/18mm HG in hypertensive participants with diabetes, heart failure or chronic kidney disease.;Long Term: Maintenance of blood pressure at goal levels.   Lipids Yes   Intervention Provide education and support for participant on nutrition & aerobic/resistive exercise along with prescribed medications to achieve LDL 70mg , HDL >40mg .   Expected Outcomes Short Term: Participant states understanding of desired cholesterol values and is compliant with medications prescribed. Participant is following exercise prescription and nutrition guidelines.;Long Term: Cholesterol controlled with medications as prescribed, with individualized exercise RX and with personalized nutrition plan. Value goals: LDL < 70mg , HDL > 40 mg.      Core Components/Risk Factors/Patient Goals Review:    Core Components/Risk Factors/Patient Goals at Discharge (Final Review):    ITP Comments:     ITP Comments    Row Name 02/22/16 1345           ITP Comments Dr. Armanda Magic, Medical Director           Comments: Patient attended orientation from  1300-1530 to review rules and guidelines for program. Completed 6 minute walk test, Intitial ITP, and exercise prescription.  VSS. Telemetry-sinus rhythm.   Asymptomatic.

## 2016-02-28 ENCOUNTER — Other Ambulatory Visit: Payer: Self-pay | Admitting: *Deleted

## 2016-02-28 ENCOUNTER — Encounter (HOSPITAL_COMMUNITY)
Admission: RE | Admit: 2016-02-28 | Discharge: 2016-02-28 | Disposition: A | Discharge status: Home / Self Care | Payer: BLUE CROSS/BLUE SHIELD | Source: Ambulatory Visit | Attending: Cardiology | Admitting: Cardiology

## 2016-02-28 DIAGNOSIS — I213 ST elevation (STEMI) myocardial infarction of unspecified site: Secondary | ICD-10-CM

## 2016-02-28 DIAGNOSIS — F411 Generalized anxiety disorder: Secondary | ICD-10-CM

## 2016-02-28 DIAGNOSIS — Z955 Presence of coronary angioplasty implant and graft: Secondary | ICD-10-CM | POA: Diagnosis not present

## 2016-02-28 NOTE — Progress Notes (Signed)
Daily Session Note  Patient Details  Name: Alexandra Swanson MRN: [id number] Date of Birth: 1966/01/10 Referring Provider:   Flowsheet Row CARDIAC REHAB PHASE II ORIENTATION from 02/22/2016 in [place]  Referring Provider  Adrian Prows, MD.      Encounter Date: 02/28/2016  Check In:     Session Check In - 02/28/16 1509      Check-In   Location [facility]   Staff Present Cleda Mccreedy, MS, Exercise Physiologist;Sarrah Fiorenza, RN, BSN;Molly diVincenzo, MS, ACSM RCEP, Exercise Physiologist;Olinty Celesta Aver, MS, ACSM CEP, Exercise Physiologist;Other   Supervising physician immediately available to respond to emergencies [facility] immediately available   Physician(s) Dr. Rockne Menghini   Medication changes reported     No   Fall or balance concerns reported    No   Warm-up and Cool-down Performed as group-led instruction   Resistance Training Performed Yes   VAD Patient? No     Pain Assessment   Currently in Pain? No/denies   Multiple Pain Sites No      Capillary Blood Glucose: No results found for this or any previous visit (from the past 24 hour(s)).   Goals Met:  Exercise tolerated well  Goals Unmet:  Not Applicable  Comments: Pt started cardiac rehab today.  Pt tolerated light exercise without difficulty. VSS, telemetry-Sinus Rhythm, asymptomatic.  Medication list reconciled. Pt denies barriers to medicaiton compliance.  PSYCHOSOCIAL ASSESSMENT:  PHQ-0. Pt exhibits positive coping skills, hopeful outlook with supportive family. No psychosocial needs identified at this time, no psychosocial interventions necessary.    Pt enjoys reading ,watching TV and walking.   Pt oriented to exercise equipment and routine.    Understanding verbalized.[name], RN,BSN 02/28/2016 5:31 PM   Dr. Fransico Him is Medical Director for Cardiac Rehab at [facility].

## 2016-03-01 ENCOUNTER — Telehealth: Payer: Self-pay

## 2016-03-01 ENCOUNTER — Encounter (HOSPITAL_COMMUNITY)
Admission: RE | Admit: 2016-03-01 | Discharge: 2016-03-01 | Disposition: A | Discharge status: Home / Self Care | Payer: BLUE CROSS/BLUE SHIELD | Source: Ambulatory Visit | Attending: Cardiology | Admitting: Cardiology

## 2016-03-01 DIAGNOSIS — Z955 Presence of coronary angioplasty implant and graft: Secondary | ICD-10-CM | POA: Diagnosis not present

## 2016-03-01 DIAGNOSIS — I213 ST elevation (STEMI) myocardial infarction of unspecified site: Secondary | ICD-10-CM

## 2016-03-01 NOTE — Telephone Encounter (Signed)
Rq for Alprazolam refill.   LOV 02/02/2015. Currently in cardiac rehab.

## 2016-03-02 ENCOUNTER — Other Ambulatory Visit: Payer: Self-pay | Admitting: Internal Medicine

## 2016-03-02 DIAGNOSIS — F411 Generalized anxiety disorder: Secondary | ICD-10-CM

## 2016-03-02 MED ORDER — ALPRAZOLAM 0.25 MG PO TABS: 0.2500 mg | ORAL_TABLET | Freq: Two times a day (BID) | ORAL | 3 refills | Status: DC | PRN

## 2016-03-02 NOTE — Telephone Encounter (Signed)
rx faxed to [facility].  

## 2016-03-02 NOTE — Telephone Encounter (Signed)
done

## 2016-03-03 ENCOUNTER — Encounter (HOSPITAL_COMMUNITY)
Admission: RE | Admit: 2016-03-03 | Discharge: 2016-03-03 | Disposition: A | Discharge status: Home / Self Care | Payer: BLUE CROSS/BLUE SHIELD | Source: Ambulatory Visit | Attending: Cardiology | Admitting: Cardiology

## 2016-03-03 DIAGNOSIS — I213 ST elevation (STEMI) myocardial infarction of unspecified site: Secondary | ICD-10-CM

## 2016-03-03 DIAGNOSIS — Z955 Presence of coronary angioplasty implant and graft: Secondary | ICD-10-CM

## 2016-03-06 ENCOUNTER — Encounter (HOSPITAL_COMMUNITY)
Admission: RE | Admit: 2016-03-06 | Discharge: 2016-03-06 | Disposition: A | Discharge status: Home / Self Care | Payer: BLUE CROSS/BLUE SHIELD | Source: Ambulatory Visit | Attending: Cardiology | Admitting: Cardiology

## 2016-03-06 DIAGNOSIS — Z955 Presence of coronary angioplasty implant and graft: Secondary | ICD-10-CM | POA: Diagnosis not present

## 2016-03-06 DIAGNOSIS — I213 ST elevation (STEMI) myocardial infarction of unspecified site: Secondary | ICD-10-CM

## 2016-03-07 NOTE — Progress Notes (Signed)
Entry blood pressure 150/84 yesterday. Rupinder completed exercise without difficulty. Max blood pressure noted at 152/94. Kandee said she had a stressful day at work. Exit blood pressure 114/70. Will continue to monitor the patient throughout  the program. Will fax blood pressures from cardiac rehab to Dr Verl Dicker office for review.Gladstone Lighter, RN,BSN 03/07/2016 12:21 PM

## 2016-03-08 ENCOUNTER — Encounter (HOSPITAL_COMMUNITY): Payer: BLUE CROSS/BLUE SHIELD

## 2016-03-10 ENCOUNTER — Encounter (HOSPITAL_COMMUNITY)
Admission: RE | Admit: 2016-03-10 | Discharge: 2016-03-10 | Disposition: A | Discharge status: Home / Self Care | Payer: BLUE CROSS/BLUE SHIELD | Source: Ambulatory Visit | Attending: Cardiology | Admitting: Cardiology

## 2016-03-10 DIAGNOSIS — I213 ST elevation (STEMI) myocardial infarction of unspecified site: Secondary | ICD-10-CM | POA: Diagnosis present

## 2016-03-10 DIAGNOSIS — I2109 ST elevation (STEMI) myocardial infarction involving other coronary artery of anterior wall: Secondary | ICD-10-CM | POA: Diagnosis not present

## 2016-03-10 DIAGNOSIS — Z955 Presence of coronary angioplasty implant and graft: Secondary | ICD-10-CM

## 2016-03-13 ENCOUNTER — Encounter (HOSPITAL_COMMUNITY)
Admission: RE | Admit: 2016-03-13 | Discharge: 2016-03-13 | Disposition: A | Discharge status: Home / Self Care | Payer: BLUE CROSS/BLUE SHIELD | Source: Ambulatory Visit | Attending: Cardiology | Admitting: Cardiology

## 2016-03-13 DIAGNOSIS — Z955 Presence of coronary angioplasty implant and graft: Secondary | ICD-10-CM | POA: Diagnosis not present

## 2016-03-13 NOTE — Progress Notes (Signed)
CARDIAC REHAB PHASE II  Reviewed home exercise program with patient.  Discussed THRR, RPE scale, mode/frequency of exercise, sign and symptoms and weather conditions for exercising.  Pt verbalized understanding.    Ples Specter, MS 03/13/2016 4:09 PM

## 2016-03-15 ENCOUNTER — Encounter (HOSPITAL_COMMUNITY)
Admission: RE | Admit: 2016-03-15 | Discharge: 2016-03-15 | Disposition: A | Discharge status: Home / Self Care | Payer: BLUE CROSS/BLUE SHIELD | Source: Ambulatory Visit | Attending: Cardiology | Admitting: Cardiology

## 2016-03-17 ENCOUNTER — Encounter (HOSPITAL_COMMUNITY)
Admission: RE | Admit: 2016-03-17 | Discharge: 2016-03-17 | Disposition: A | Discharge status: Home / Self Care | Payer: BLUE CROSS/BLUE SHIELD | Source: Ambulatory Visit | Attending: Cardiology | Admitting: Cardiology

## 2016-03-17 DIAGNOSIS — Z955 Presence of coronary angioplasty implant and graft: Secondary | ICD-10-CM

## 2016-03-17 DIAGNOSIS — I213 ST elevation (STEMI) myocardial infarction of unspecified site: Secondary | ICD-10-CM

## 2016-03-20 ENCOUNTER — Encounter (HOSPITAL_COMMUNITY)
Admission: RE | Admit: 2016-03-20 | Discharge: 2016-03-20 | Disposition: A | Discharge status: Home / Self Care | Payer: BLUE CROSS/BLUE SHIELD | Source: Ambulatory Visit | Attending: Cardiology | Admitting: Cardiology

## 2016-03-20 DIAGNOSIS — I213 ST elevation (STEMI) myocardial infarction of unspecified site: Secondary | ICD-10-CM

## 2016-03-20 DIAGNOSIS — Z955 Presence of coronary angioplasty implant and graft: Secondary | ICD-10-CM

## 2016-03-22 ENCOUNTER — Encounter (HOSPITAL_COMMUNITY)
Admission: RE | Admit: 2016-03-22 | Discharge: 2016-03-22 | Disposition: A | Discharge status: Home / Self Care | Payer: BLUE CROSS/BLUE SHIELD | Source: Ambulatory Visit | Attending: Cardiology | Admitting: Cardiology

## 2016-03-22 VITALS — Wt 142.4 lb

## 2016-03-22 DIAGNOSIS — Z955 Presence of coronary angioplasty implant and graft: Secondary | ICD-10-CM | POA: Diagnosis not present

## 2016-03-22 DIAGNOSIS — I213 ST elevation (STEMI) myocardial infarction of unspecified site: Secondary | ICD-10-CM

## 2016-03-24 ENCOUNTER — Encounter (HOSPITAL_COMMUNITY)
Admission: RE | Admit: 2016-03-24 | Discharge: 2016-03-24 | Disposition: A | Discharge status: Home / Self Care | Payer: BLUE CROSS/BLUE SHIELD | Source: Ambulatory Visit | Attending: Cardiology | Admitting: Cardiology

## 2016-03-24 DIAGNOSIS — Z955 Presence of coronary angioplasty implant and graft: Secondary | ICD-10-CM | POA: Diagnosis not present

## 2016-03-24 DIAGNOSIS — I213 ST elevation (STEMI) myocardial infarction of unspecified site: Secondary | ICD-10-CM

## 2016-03-27 ENCOUNTER — Encounter (HOSPITAL_COMMUNITY)
Admission: RE | Admit: 2016-03-27 | Discharge: 2016-03-27 | Disposition: A | Discharge status: Home / Self Care | Payer: BLUE CROSS/BLUE SHIELD | Source: Ambulatory Visit | Attending: Cardiology | Admitting: Cardiology

## 2016-03-27 DIAGNOSIS — Z955 Presence of coronary angioplasty implant and graft: Secondary | ICD-10-CM

## 2016-03-27 DIAGNOSIS — I213 ST elevation (STEMI) myocardial infarction of unspecified site: Secondary | ICD-10-CM

## 2016-03-29 ENCOUNTER — Encounter (HOSPITAL_COMMUNITY)
Admission: RE | Admit: 2016-03-29 | Discharge: 2016-03-29 | Disposition: A | Discharge status: Home / Self Care | Payer: BLUE CROSS/BLUE SHIELD | Source: Ambulatory Visit | Attending: Cardiology | Admitting: Cardiology

## 2016-03-29 DIAGNOSIS — I213 ST elevation (STEMI) myocardial infarction of unspecified site: Secondary | ICD-10-CM

## 2016-03-29 DIAGNOSIS — Z955 Presence of coronary angioplasty implant and graft: Secondary | ICD-10-CM

## 2016-04-03 ENCOUNTER — Encounter (HOSPITAL_COMMUNITY)
Admission: RE | Admit: 2016-04-03 | Discharge: 2016-04-03 | Disposition: A | Discharge status: Home / Self Care | Payer: BLUE CROSS/BLUE SHIELD | Source: Ambulatory Visit | Attending: Cardiology | Admitting: Cardiology

## 2016-04-03 DIAGNOSIS — Z955 Presence of coronary angioplasty implant and graft: Secondary | ICD-10-CM | POA: Diagnosis not present

## 2016-04-03 DIAGNOSIS — I213 ST elevation (STEMI) myocardial infarction of unspecified site: Secondary | ICD-10-CM

## 2016-04-05 ENCOUNTER — Encounter (HOSPITAL_COMMUNITY): Payer: BLUE CROSS/BLUE SHIELD

## 2016-04-07 ENCOUNTER — Encounter (HOSPITAL_COMMUNITY): Payer: BLUE CROSS/BLUE SHIELD

## 2016-04-10 ENCOUNTER — Encounter (HOSPITAL_COMMUNITY): Payer: BLUE CROSS/BLUE SHIELD

## 2016-04-12 ENCOUNTER — Encounter: Payer: Self-pay | Admitting: Internal Medicine

## 2016-04-12 ENCOUNTER — Encounter (HOSPITAL_COMMUNITY): Admission: RE | Admit: 2016-04-12 | Payer: BLUE CROSS/BLUE SHIELD | Source: Ambulatory Visit

## 2016-04-14 ENCOUNTER — Telehealth (HOSPITAL_COMMUNITY): Payer: Self-pay | Admitting: *Deleted

## 2016-04-17 ENCOUNTER — Encounter: Payer: Self-pay | Admitting: Neurology

## 2016-04-17 ENCOUNTER — Other Ambulatory Visit: Payer: Self-pay | Admitting: Neurology

## 2016-04-17 ENCOUNTER — Ambulatory Visit (INDEPENDENT_AMBULATORY_CARE_PROVIDER_SITE_OTHER): Payer: BLUE CROSS/BLUE SHIELD | Admitting: Neurology

## 2016-04-17 VITALS — BP 138/73 | HR 64 | Ht 67.0 in | Wt 142.6 lb

## 2016-04-17 DIAGNOSIS — G379 Demyelinating disease of central nervous system, unspecified: Secondary | ICD-10-CM

## 2016-04-17 DIAGNOSIS — G35 Multiple sclerosis: Secondary | ICD-10-CM

## 2016-04-17 NOTE — Progress Notes (Signed)
[facility]    Provider:  Dr Lucia Gaskins Referring Provider: Rodolph Bong MS Primary Care Physician:  Sanda Linger, MD  CC:  Multiple sclerosis, demyelinating plaques found in the cervical spine  HPI:  Alexandra Swanson is a 50 y.o. female here as a referral from Dr. Penni Bombard  for multiple sclerosis. PMHx CAD, high blood pressure, myocardial infarction, current etoh 5-7 drinks a week, never smoked.  Tingling in the hands started years ago mostly pain, thought it was carpal tunnel. She was being treated her [facility] orthopedics. She presented with wrist pain and numbness. She was treated with bracing and other conservative measures. EMG and nerve conduction study was performed. MRI of the cervical spine (without contrast) revealed likely demyelinating plaque. She has chronic neck pain. She has had vision changes progressively,  vision has been worsening, never lost vision temporarily, never had urinary issues, no gait disorders, no imbalance, no other sensory disturbances or weakness. No trauma. She has chronic neck pain and some pain in her arms. Her maternal uncle had MS. no inciting events, no other focal neurologic deficits, no other associated symptoms are modifiable factors.  Reviewed notes, labs and imaging from outside physicians, which showed:  Reviewed EMG nerve conduction study due to findings. Nerve conductions included bilateral median motor and sensory, bilateral radial sensory, bilateral ulnar sensory and motor, mixed palmar sensory nerve studies bilateral, all were normal. EMG of the APB muscle on the left and right are normal  Personally reviewed images of the cervical spine without contrast which showed T2 hyperintensity in the right paramedian dorsal aspect of the cord and upper C5 body spinning 4 mm smaller focus in the midline dorsal aspect of the cord at the C4-C5 level, additional questionable punctate long segment focus of T2 hyperintensities. The dorsal midline  aspect of the cord from C5-T1 levels. Very suspicious for demyelinating lesions however differential includes subacute combined degeneration other metabolic deficiencies for dorsal column lesions.  Review of Systems: Patient complains of symptoms per HPI as well as the following symptoms: no CP, no SOB. Pertinent negatives per HPI. All others negative.   Social History   Social History  . Marital status: Divorced    Spouse name: N/A  . Number of children: 1  . Years of education: BS   Occupational History  . supervisor    Social History Main Topics  . Smoking status: Never Smoker  . Smokeless tobacco: Never Used  . Alcohol use 7.2 oz/week    7 Glasses of wine, 5 Cans of beer per week     Comment: social  . Drug use: No  . Sexual activity: Yes    Birth control/ protection: Pill   Other Topics Concern  . Not on file   Social History Narrative   Lives with son   Caffeine use:  Drinks 1 cup coffee per day   1 diet soda per day    Family History  Problem Relation Age of Onset  . Hyperlipidemia Mother   . Hypertension Mother   . Heart disease Father   . Alcohol abuse Father   . Alcohol abuse Other   . Drug abuse Other   . Hyperlipidemia Other   . Hypertension Other   . Stroke Other   . Cancer Neg Hx   . COPD Neg Hx   . Diabetes Neg Hx   . Early death Neg Hx   . Hearing loss Neg Hx   . Kidney disease Neg Hx     Past  Medical History:  Diagnosis Date  . Anxiety   . Heart attack 12/31/2015  . Hyperlipidemia   . Hypertension     Past Surgical History:  Procedure Laterality Date  . CARDIAC CATHETERIZATION N/A 12/31/2015   Procedure: Left Heart Cath and Coronary Angiography;  Surgeon: Yates DecampJay Ganji, MD;  [facility];  Service: Cardiovascular;  Laterality: N/A;  . CARDIAC CATHETERIZATION N/A 12/31/2015   Procedure: Coronary Stent Intervention;  Surgeon: Yates DecampJay Ganji, MD;  [facility];  Service: Cardiovascular;  Laterality: N/A;  .  CESAREAN SECTION  1999  . CHOLECYSTECTOMY  2010    Current Outpatient Prescriptions  Medication Sig Dispense Refill  . ALPRAZolam (XANAX) 0.25 MG tablet Take 1 tablet (0.25 mg total) by mouth 2 (two) times daily as needed. for anxiety 30 tablet 3  . aspirin 81 MG tablet Take 81 mg by mouth daily.     Marland Kitchen. atorvastatin (LIPITOR) 80 MG tablet Take 1 tablet (80 mg total) by mouth daily at 6 PM. 60 tablet 1  . Biotin 1000 MCG tablet Take 1,000 mcg by mouth daily.    . carvedilol (COREG) 12.5 MG tablet Take 0.5 tablets (6.25 mg total) by mouth 2 (two) times daily with a meal. (Patient taking differently: Take 12.5 mg by mouth 2 (two) times daily with a meal. ) 60 tablet 1  . fexofenadine (ALLEGRA) 180 MG tablet Take 180 mg by mouth daily.    . fluticasone (FLONASE) 50 MCG/ACT nasal spray Place 2 sprays into both nostrils daily as needed for allergies or rhinitis.    Marland Kitchen. losartan (COZAAR) 50 MG tablet Take 0.5 tablets (25 mg total) by mouth daily. (Patient taking differently: Take 50 mg by mouth daily. 2 tablets per day) 30 tablet 1  . montelukast (SINGULAIR) 10 MG tablet Take 1 tablet (10 mg total) by mouth at bedtime. (Patient taking differently: Take 10 mg by mouth daily. ) 90 tablet 3  . norethindrone-ethinyl estradiol-iron (LOESTRIN FE 1.5/30) 1.5-30 MG-MCG tablet Take 1 tablet by mouth daily. 1 Package 11  . ticagrelor (BRILINTA) 90 MG TABS tablet Take 1 tablet (90 mg total) by mouth 2 (two) times daily. 60 tablet 0   No current facility-administered medications for this visit.     Allergies as of 04/17/2016 - Review Complete 04/17/2016  Allergen Reaction Noted  . Clarithromycin Nausea Only     Vitals: BP 138/73 (BP Location: Right Arm, Patient Position: Sitting, Cuff Size: Normal)   Pulse 64   Ht 5\' 7"  (1.702 m)   Wt 142 lb 9.6 oz (64.7 kg)   BMI 22.33 kg/m  Last Weight:  Wt Readings from Last 1 Encounters:  04/17/16 142 lb 9.6 oz (64.7 kg)   Last Height:   Ht Readings from Last 1  Encounters:  04/17/16 5\' 7"  (1.702 m)    Physical exam: Exam: Gen: NAD, conversant, well nourised, well groomed                     CV: RRR, no MRG. No Carotid Bruits. No peripheral edema, warm, nontender Eyes: Conjunctivae clear without exudates or hemorrhage  Neuro: Detailed Neurologic Exam  Speech:    Speech is normal; fluent and spontaneous with normal comprehension.  Cognition:    The patient is oriented to person, place, and time;     recent and remote memory intact;     language fluent;     normal attention, concentration,     fund of knowledge Cranial  Nerves:    The pupils are equal, round, and reactive to light. The fundi are normal and spontaneous venous pulsations are present. Visual fields are full to finger confrontation. Extraocular movements are intact. Trigeminal sensation is intact and the muscles of mastication are normal. The face is symmetric. The palate elevates in the midline. Hearing intact. Voice is normal. Shoulder shrug is normal. The tongue has normal motion without fasciculations.   Coordination:    Normal finger to nose and heel to shin. Normal rapid alternating movements.   Gait:    Heel-toe and tandem gait are normal.   Motor Observation:    No asymmetry, no atrophy, and no involuntary movements noted. Tone:    Normal muscle tone.    Posture:    Posture is normal. normal erect    Strength:    Strength is V/V in the upper and lower limbs.      Sensation: intact to LT     Reflex Exam:  DTR's:    Deep tendon reflexes in the upper and lower extremities are normal bilaterally.   Toes:    The toes are downgoing bilaterally.   Clonus:    Clonus is absent.      Assessment/Plan:  This is a 50 year old patient with chronic pain and paresthesias and numbness in the hands. MRI of the cervical spine with several lesions suspicious for multiple sclerosis versus subacute combined degeneration or other metabolic or other causes for dorsal column  lesions.  MRI of the cervical cord needs to be completed with and without contrast to evaluate for enhancement of the lesions MRI of the brain with and without contrast to evaluate for demyelination in the brain Extensive lab work for metabolic disorders, vitamin deficiencies, and other causes or cervical cord lesions including neuromyelitis optica.   Naomie Dean, MD  [facility] [address] [place], [place] [address]  Phone [phone] Fax [phone]

## 2016-04-17 NOTE — Patient Instructions (Addendum)
Remember to drink plenty of fluid, eat healthy meals and do not skip any meals. Try to eat protein with a every meal and eat a healthy snack such as fruit or nuts in between meals. Try to keep a regular sleep-wake schedule and try to exercise daily, particularly in the form of walking, 20-30 minutes a day, if you can.   As far as diagnostic testing: Labs, MRI brain and cervical spine  I would like to see you back after imaging we will decide next steps, sooner if we need to. Please call us with any interim questions, concerns, problems, updates or refill requests.   Our phone number is [phone]. We also have an after hours call service for urgent matters and there is a physician on-call for urgent questions. For any emergencies you know to call 911 or go to the nearest emergency room

## 2016-04-21 ENCOUNTER — Telehealth (HOSPITAL_COMMUNITY): Payer: Self-pay | Admitting: *Deleted

## 2016-04-24 ENCOUNTER — Telehealth: Payer: Self-pay | Admitting: *Deleted

## 2016-04-24 LAB — CBC WITH DIFFERENTIAL/PLATELET
BASOS ABS: 0 10*3/uL (ref 0.0–0.2)
Basos: 0 %
EOS (ABSOLUTE): 0.1 10*3/uL (ref 0.0–0.4)
Eos: 3 %
Hematocrit: 39.2 % (ref 34.0–46.6)
Hemoglobin: 13 g/dL (ref 11.1–15.9)
IMMATURE GRANULOCYTES: 0 %
Immature Grans (Abs): 0 10*3/uL (ref 0.0–0.1)
LYMPHS: 29 %
Lymphocytes Absolute: 1.2 10*3/uL (ref 0.7–3.1)
MCH: 30.1 pg (ref 26.6–33.0)
MCHC: 33.2 g/dL (ref 31.5–35.7)
MCV: 91 fL (ref 79–97)
Monocytes Absolute: 0.3 10*3/uL (ref 0.1–0.9)
Monocytes: 7 %
NEUTROS PCT: 61 %
Neutrophils Absolute: 2.6 10*3/uL (ref 1.4–7.0)
PLATELETS: 212 10*3/uL (ref 150–379)
RBC: 4.32 x10E6/uL (ref 3.77–5.28)
RDW: 13.6 % (ref 12.3–15.4)
WBC: 4.3 10*3/uL (ref 3.4–10.8)

## 2016-04-24 LAB — NEUROMYELITIS OPTICA AUTOAB, IGG

## 2016-04-24 LAB — SEDIMENTATION RATE: Sed Rate: 5 mm/hr (ref 0–32)

## 2016-04-24 LAB — COMPREHENSIVE METABOLIC PANEL
ALT: 23 IU/L (ref 0–32)
AST: 19 IU/L (ref 0–40)
Albumin/Globulin Ratio: 1.6 (ref 1.2–2.2)
Albumin: 4.2 g/dL (ref 3.5–5.5)
Alkaline Phosphatase: 34 IU/L — ABNORMAL LOW (ref 39–117)
BUN/Creatinine Ratio: 13 (ref 9–23)
BUN: 9 mg/dL (ref 6–24)
Bilirubin Total: 0.6 mg/dL (ref 0.0–1.2)
CALCIUM: 9.1 mg/dL (ref 8.7–10.2)
CO2: 20 mmol/L (ref 18–29)
Chloride: 103 mmol/L (ref 96–106)
Creatinine, Ser: 0.67 mg/dL (ref 0.57–1.00)
GFR, EST AFRICAN AMERICAN: 119 mL/min/{1.73_m2} (ref >59)
GFR, EST NON AFRICAN AMERICAN: 104 mL/min/{1.73_m2} (ref >59)
GLUCOSE: 77 mg/dL (ref 65–99)
Globulin, Total: 2.7 g/dL (ref 1.5–4.5)
Potassium: 4.6 mmol/L (ref 3.5–5.2)
Sodium: 144 mmol/L (ref 134–144)
TOTAL PROTEIN: 6.9 g/dL (ref 6.0–8.5)

## 2016-04-24 LAB — ANA COMPREHENSIVE PANEL
Anti JO-1: <0.2 AI (ref 0.0–0.9)
Centromere Ab Screen: <0.2 AI (ref 0.0–0.9)
Chromatin Ab SerPl-aCnc: <0.2 AI (ref 0.0–0.9)
dsDNA Ab: <1 IU/mL (ref 0–9)

## 2016-04-24 LAB — RPR: RPR: NONREACTIVE

## 2016-04-24 LAB — RHEUMATOID FACTOR: Rhuematoid fact SerPl-aCnc: <10 IU/mL (ref 0.0–13.9)

## 2016-04-24 LAB — B12 AND FOLATE PANEL
Folate: 5.6 ng/mL (ref >3.0)
Vitamin B-12: 231 pg/mL — ABNORMAL LOW (ref 232–1245)

## 2016-04-24 LAB — ANA: ANA TITER 1: NEGATIVE

## 2016-04-24 LAB — CERULOPLASMIN: CERULOPLASMIN: 40.2 mg/dL — AB (ref 19.0–39.0)

## 2016-04-24 LAB — COPPER, SERUM: COPPER: 160 ug/dL (ref 72–166)

## 2016-04-24 LAB — VITAMIN B1: Thiamine: 99.2 nmol/L (ref 66.5–200.0)

## 2016-04-24 LAB — HTLV I+II ANTIBODIES, (EIA), BLD: HTLV I/II Ab: NEGATIVE

## 2016-04-24 LAB — METHYLMALONIC ACID, SERUM: Methylmalonic Acid: 369 nmol/L (ref 0–378)

## 2016-04-24 LAB — B. BURGDORFI ANTIBODIES: Lyme IgG/IgM Ab: <0.91 {ISR} (ref 0.00–0.90)

## 2016-04-24 LAB — HIV ANTIBODY (ROUTINE TESTING W REFLEX): HIV SCREEN 4TH GENERATION: NONREACTIVE

## 2016-04-24 NOTE — Telephone Encounter (Signed)
Pt called office back. I relayed results per AA,MD note. She verbalized understanding. She will come to our office for B12 injections. Scheduled first injection on 04/26/16 at 945am prior to her MRI appt. Pt requested this.

## 2016-04-24 NOTE — Telephone Encounter (Signed)
LVM for pt to call about results. Gave [facility] phone number.  

## 2016-04-24 NOTE — Telephone Encounter (Signed)
-----   Message from Anson Fret, MD sent at 04/23/2016  6:52 PM EST ----- Patient has low B12. We will wait and see what the imaging shows but low B12 can cause something called subacute combined degeneration which could be consistent with the changes in her spine. I recommend b12 shots weekly for 4 weeks and then once a month for 6 months and then B12 daily po long term. And we will see what we find after imaging.  B12 deficiency can cause weakness and spinal cord damage, fatigue, easy bruising or bleeding,sore tongue, stomach upset, weight loss, and diarrhea or constipation, tingling or numbness to the fingers and toes, difficulty walking, mood changes, depression, memory loss, disorientation and, in severe cases, dementia. Thanks.

## 2016-04-25 ENCOUNTER — Encounter (HOSPITAL_COMMUNITY): Payer: Self-pay | Admitting: *Deleted

## 2016-04-25 DIAGNOSIS — Z955 Presence of coronary angioplasty implant and graft: Secondary | ICD-10-CM

## 2016-04-25 DIAGNOSIS — I213 ST elevation (STEMI) myocardial infarction of unspecified site: Secondary | ICD-10-CM

## 2016-04-25 NOTE — Progress Notes (Signed)
Discharge Summary  Patient Details  Name: Alexandra SongsterSusan Dibartolo MRN: [id number] Date of Birth: 05/18/1965 Referring Provider:   Flowsheet Row CARDIAC REHAB PHASE II ORIENTATION [facility]  Referring Provider  Yates DecampGanji, Jay, MD.       Number of Visits: 14  Reason for Discharge:  Early Exit:  Back to work  Smoking History:  History  Smoking Status  . Never Smoker  Smokeless Tobacco  . Never Used    Diagnosis:  Stented coronary artery  ST elevation myocardial infarction (STEMI), unspecified artery (HCC)  ADL UCSD:   Initial Exercise Prescription:   Discharge Exercise Prescription (Final Exercise Prescription Changes):     Exercise Prescription Changes - 05/18/16 1700      Response to Exercise   Blood Pressure (Admit) 120/82   Blood Pressure (Exercise) 128/82   Blood Pressure (Exit) 120/80   Heart Rate (Admit) 70 bpm   Heart Rate (Exercise) 123 bpm   Heart Rate (Exit) 70 bpm   Rating of Perceived Exertion (Exercise) 12   Duration Progress to 50 minutes of aerobic without signs/symptoms of physical distress   Intensity THRR unchanged     Progression   Progression Continue to progress workloads to maintain intensity without signs/symptoms of physical distress.   Average METs 5.3     Resistance Training   Training Prescription Yes   Weight 4lbs   Reps 10-12     Treadmill   MPH 3.5   Grade 2   Minutes 10   METs 4.65     Bike   Level 1.8   Minutes 10   METs 6.26     NuStep   Level 4   Minutes 10   METs 4.9      Functional Capacity:   Psychological, QOL, Others - Outcomes: PHQ 2/9: Depression screen PHQ 2/9 02/28/2016  Decreased Interest 0  Down, Depressed, Hopeless 0  PHQ - 2 Score 0    Quality of Life:     Quality of Life - 02/28/16 1727      Quality of Life Scores   GLOBAL Pre --  no needs identifed at this time.      Personal Goals: Goals established at orientation with  interventions provided to work toward goal.    Personal Goals Discharge:     Goals and Risk Factor Review    Row Name 03/15/16 1704             Core Components/Risk Factors/Patient Goals Review   Personal Goals Review Other       Review Pt is walking 3530min/day 3 days/week outside of cardiac rehab and states she feels great.  Pt wants to learn her limitations with exercise        Expected Outcomes Pt will be able to exercise [facility] and increase overall cardiovascular fitness capacity.           Nutrition & Weight - Outcomes:      Post Biometrics - 05/18/16 1706       Post  Biometrics   Weight 142 lb 6.7 oz (64.6 kg)      Nutrition:   Nutrition Discharge:     Nutrition Assessments - 05/22/16 0925      MEDFICTS Scores   Pre Score 18      Education Questionnaire Score:   Darl PikesSusan attended 14 sessions. [name]'s last exercise session was on 04/03/2016. Darl PikesSusan did well will exercise when she was able to attend. Darl PikesSusan did have some  occasional resting and exertional blood pressure elevations at cardiac rehab. Carlyon says she will continue exercising on her own via walking. We appreciate the referral and enjoyed working with [name]. Melannie did not return to exercise to do a follow up walk test.Viney Acocella Mila Palmer RN BSN

## 2016-04-26 ENCOUNTER — Ambulatory Visit (INDEPENDENT_AMBULATORY_CARE_PROVIDER_SITE_OTHER): Payer: BLUE CROSS/BLUE SHIELD

## 2016-04-26 ENCOUNTER — Telehealth: Payer: Self-pay | Admitting: Neurology

## 2016-04-26 ENCOUNTER — Telehealth: Payer: Self-pay | Admitting: *Deleted

## 2016-04-26 DIAGNOSIS — G35 Multiple sclerosis: Secondary | ICD-10-CM | POA: Diagnosis not present

## 2016-04-26 DIAGNOSIS — G379 Demyelinating disease of central nervous system, unspecified: Secondary | ICD-10-CM

## 2016-04-26 DIAGNOSIS — E538 Deficiency of other specified B group vitamins: Secondary | ICD-10-CM

## 2016-04-26 MED ORDER — CYANOCOBALAMIN 1000 MCG/ML IJ SOLN
1000.0000 ug | Freq: Once | INTRAMUSCULAR | Status: AC
  Administered 2016-04-26: 1000 ug via INTRAMUSCULAR

## 2016-04-26 MED ORDER — "NEEDLE (DISP) 30G X 1"" MISC": 0 refills | Status: DC

## 2016-04-26 MED ORDER — CYANOCOBALAMIN 1000 MCG/ML IJ SOLN: INTRAMUSCULAR | 0 refills | Status: DC

## 2016-04-26 MED ORDER — "NEEDLE (DISP) 18G X 1"" MISC": 0 refills | Status: DC

## 2016-04-26 MED ORDER — SYRINGE (DISPOSABLE) 3 ML MISC: 0 refills | Status: DC

## 2016-04-26 NOTE — Progress Notes (Signed)
Gave injection for b12. In left deltoid. Clean with alcohol wipes prior to. Pt tolerate injection.

## 2016-04-26 NOTE — Telephone Encounter (Signed)
Dr Lucia Gaskins- patient came for her first injection of B12 today. She wants to if we can place the order and send to her pharmacy for her to do them at home? She has RN as friends and they can administer B12 injections for her at home. She said this would be easier for her if possible.

## 2016-04-26 NOTE — Telephone Encounter (Signed)
Called and spoke to Glenarden from [employer]. Advised ok to use 25G needle instead of 30G. She verbalized understanding.

## 2016-04-26 NOTE — Telephone Encounter (Signed)
Faxed orders to pt pharmacy for B12 injections to be done at home. Fax: [phone]. Received confirmation.

## 2016-04-26 NOTE — Telephone Encounter (Signed)
Happy to do this, but no idea how. Can you call her pharmacy and see if I can do this and what to order? thanks

## 2016-04-26 NOTE — Telephone Encounter (Signed)
Stephanie @ [facility] is calling to see if pt can have 25 gauge needles instead of 30 gauge needles. Please call and advise.

## 2016-04-26 NOTE — Telephone Encounter (Signed)
Called and spoke to pt. Advised we sent orders to her pharmacy for B12 injections with supplies. Went over instructions again with patient. She verbalized understanding. She was wondering when she will find out results of MRI she had completed today. Advised usually within the next week we will call with results. She verbalized understanding.

## 2016-04-27 MED ORDER — GADOPENTETATE DIMEGLUMINE 469.01 MG/ML IV SOLN: 13.0000 mL | Freq: Once | INTRAVENOUS | Status: DC | PRN

## 2016-04-28 ENCOUNTER — Encounter: Payer: Self-pay | Admitting: Neurology

## 2016-04-29 ENCOUNTER — Telehealth: Payer: Self-pay | Admitting: Neurology

## 2016-04-29 ENCOUNTER — Other Ambulatory Visit: Payer: Self-pay | Admitting: Neurology

## 2016-04-29 DIAGNOSIS — D6859 Other primary thrombophilia: Secondary | ICD-10-CM

## 2016-04-29 DIAGNOSIS — I639 Cerebral infarction, unspecified: Secondary | ICD-10-CM

## 2016-04-29 NOTE — Telephone Encounter (Signed)
Spoke to patient regarding lacunar infarcts of undetermined chronicity, may have been years ago or during cath, very unclear still feel a CTA of the head and neck are warranted to evaluate for cerebrovascular thromboembolic disease especially given her vascular history.Patient will come in for hypercoag labs   Irving Burton, any way we can get CTAs of patient before the end of the year?  Kara Mead, can you schedule a follow for patient with me in January? You can use one of our reserved appointments at noon or 4/430 or any available.  Thank you

## 2016-04-29 NOTE — Telephone Encounter (Signed)
called patient to discuss left messages will try back later.  IMPRESSION:  This MRI of the brain with and without contrast shows the following: 1.   There is a focus involving the basal ganglia on the left consistent with a small chronic lacunar infarction.   There is another focus in the lentiform nucleus on the right, also consistent with a chronic ischemic focus.     A few punctate T2/FLAIR hyperintense foci in the subcortical white matter of the frontal lobes are most consistent with a minimal chronic microvascular ischemic change. Demyelination would be less likely to cause these findings. 2.   There are no acute findings.

## 2016-04-29 NOTE — Telephone Encounter (Signed)
Discussed with patient, see other phone note

## 2016-04-29 NOTE — Progress Notes (Signed)
Ms. Alexandra Swanson is a 50 y.o. female with history of migraine headaches, obesity, anemia and family history of coagulopathy, with left-sided numbness. MRi of the brain showed ... concerning for cerebral vasoconstriction syndromes, Which may include vasculitidies (primary CNS angitis, giant cell arteritis, granulomatous disease, other systemic rheumatologic diseases), hypercoagulable states (autoimmune or paraneoplastic), infectious processes (meningo-encephalitis), subarachnoid hemorrhage, hemoglobinopathies, as well a group of conditions associated with so called reversible cerebral vasoconstriction syndrome (RCVS). Extensive workup for above causes revealed low protein s activity   antithrombin 3, protein C activity and  total, protein S activity, protein S total, lupus anticoagulant profile, beta 2 glycoprotein, homocysteine, factor V Leiden, prothrombin gene mutation, cardiolipin antibodies,sed rate,hemoglobinopathy, , tsh, pan-anca, complement total, c3 and c4 complement, lupus anticoagulant, homocysteine,  sickle cell screen,ace, ror, hiv, sickle cell screen    Dr. Erlinda Hong:   Actually, for arterial stroke, check lupus anticoagulant, beta 2 glycoprotein antibodies, homocysteine, cardiolipin antibodies, sickle cell screen if AA, metastasis tumor work up if indicated.  For venous stroke, besides above, check antithrombin 3, protein C and protein S, factor V leiden, prothrombin gene mutation  For vasculitis work up, C3, C4, ANA, p-ANCA, c-ANCA, dsDNA, ESR, CRP, RF, RPR, HIV, SSA, SSB, smith antibody. However, we do ANA, ESR, CRP, RPR, HIV as screen tests. If positive, will do the rest.

## 2016-05-03 ENCOUNTER — Encounter: Payer: Self-pay | Admitting: Neurology

## 2016-05-03 NOTE — Telephone Encounter (Signed)
Noted, thank you. No f/u in January needed. Per AA,MD offer to make 6 month f/u

## 2016-05-03 NOTE — Telephone Encounter (Signed)
Called and LVM for pt advising AA,MD received her message and she is ok with her holding off on CT. Advised her to call back if she changes her mind.   Asked her to call back to make 6 month f/u (In June sometime).  *Please schedule if she calls back.

## 2016-05-03 NOTE — Telephone Encounter (Signed)
Thanks, that is fine.

## 2016-05-03 NOTE — Telephone Encounter (Signed)
Called and LVM for pt to call back and schedule f/u per AA,MD request in [place].  *Please ask pt what works for her and I can place on schedule if slot is blocked, thank you!

## 2016-05-03 NOTE — Telephone Encounter (Signed)
I called the patient to inform her that I got both of the CT"s authorized and she informed me that she does not want to do it now. She said her Cardiologist said she was fine and that she had no time this week to do it and that she didn't want to have to pay for it. So for now she is waiting on having these CT's.

## 2016-05-04 NOTE — Telephone Encounter (Signed)
Pt sent mychart message. Scheduled pt for 10/17/16 at 10am. Pt will come for labs. Gave hours in [employer].

## 2016-05-18 NOTE — Addendum Note (Signed)
Encounter addended by: Ples Specter on: 05/18/2016  5:10 PM<BR>    Actions taken: Flowsheet data copied forward, Flowsheet accepted, Visit Navigator Flowsheet section accepted

## 2016-05-22 NOTE — Addendum Note (Signed)
Encounter addended by: Jacques Earthly, RD on: 05/22/2016  9:34 AM<BR>    Actions taken: Visit Navigator Flowsheet section accepted

## 2016-06-03 ENCOUNTER — Encounter: Payer: Self-pay | Admitting: Internal Medicine

## 2016-06-06 ENCOUNTER — Ambulatory Visit: Payer: BLUE CROSS/BLUE SHIELD | Admitting: Nurse Practitioner

## 2016-06-20 ENCOUNTER — Encounter (HOSPITAL_COMMUNITY): Payer: Self-pay | Admitting: Internal Medicine

## 2016-06-20 ENCOUNTER — Telehealth (HOSPITAL_COMMUNITY): Payer: Self-pay | Admitting: Internal Medicine

## 2016-06-20 NOTE — Telephone Encounter (Signed)
Mailed letter with Cardiac Rehab Program along with my chart message... Alexandra Swanson  °

## 2016-07-11 ENCOUNTER — Encounter: Payer: Self-pay | Admitting: Internal Medicine

## 2016-07-11 ENCOUNTER — Ambulatory Visit (INDEPENDENT_AMBULATORY_CARE_PROVIDER_SITE_OTHER): Payer: BLUE CROSS/BLUE SHIELD | Admitting: Internal Medicine

## 2016-07-11 ENCOUNTER — Other Ambulatory Visit (INDEPENDENT_AMBULATORY_CARE_PROVIDER_SITE_OTHER): Payer: BLUE CROSS/BLUE SHIELD

## 2016-07-11 VITALS — BP 120/82 | HR 78 | Temp 98.0°F | Resp 16 | Ht 67.0 in | Wt 142.5 lb

## 2016-07-11 DIAGNOSIS — Z0000 Encounter for general adult medical examination without abnormal findings: Secondary | ICD-10-CM

## 2016-07-11 DIAGNOSIS — F411 Generalized anxiety disorder: Secondary | ICD-10-CM

## 2016-07-11 DIAGNOSIS — J301 Allergic rhinitis due to pollen: Secondary | ICD-10-CM | POA: Diagnosis not present

## 2016-07-11 LAB — CBC WITH DIFFERENTIAL/PLATELET
BASOS PCT: 0.4 % (ref 0.0–3.0)
Basophils Absolute: 0 10*3/uL (ref 0.0–0.1)
Eosinophils Absolute: 0.2 10*3/uL (ref 0.0–0.7)
Eosinophils Relative: 2.4 % (ref 0.0–5.0)
HEMATOCRIT: 42 % (ref 36.0–46.0)
HEMOGLOBIN: 14 g/dL (ref 12.0–15.0)
LYMPHS PCT: 19.8 % (ref 12.0–46.0)
Lymphs Abs: 1.9 10*3/uL (ref 0.7–4.0)
MCHC: 33.4 g/dL (ref 30.0–36.0)
MCV: 90.8 fl (ref 78.0–100.0)
MONOS PCT: 5.5 % (ref 3.0–12.0)
Monocytes Absolute: 0.5 10*3/uL (ref 0.1–1.0)
NEUTROS ABS: 6.9 10*3/uL (ref 1.4–7.7)
Neutrophils Relative %: 71.9 % (ref 43.0–77.0)
PLATELETS: 258 10*3/uL (ref 150.0–400.0)
RBC: 4.63 Mil/uL (ref 3.87–5.11)
RDW: 15 % (ref 11.5–15.5)
WBC: 9.6 10*3/uL (ref 4.0–10.5)

## 2016-07-11 LAB — COMPREHENSIVE METABOLIC PANEL
ALT: 22 U/L (ref 0–35)
AST: 20 U/L (ref 0–37)
Albumin: 3.9 g/dL (ref 3.5–5.2)
Alkaline Phosphatase: 29 U/L — ABNORMAL LOW (ref 39–117)
BUN: 11 mg/dL (ref 6–23)
CALCIUM: 9.2 mg/dL (ref 8.4–10.5)
CHLORIDE: 103 meq/L (ref 96–112)
CO2: 27 meq/L (ref 19–32)
Creatinine, Ser: 0.7 mg/dL (ref 0.40–1.20)
GFR: 94.21 mL/min (ref >60.00)
Glucose, Bld: 99 mg/dL (ref 70–99)
POTASSIUM: 5.1 meq/L (ref 3.5–5.1)
Sodium: 137 mEq/L (ref 135–145)
Total Bilirubin: 0.7 mg/dL (ref 0.2–1.2)
Total Protein: 7 g/dL (ref 6.0–8.3)

## 2016-07-11 LAB — LIPID PANEL
CHOL/HDL RATIO: 2
Cholesterol: 113 mg/dL (ref 0–200)
HDL: 53.9 mg/dL (ref >39.00)
LDL CALC: 41 mg/dL (ref 0–99)
NonHDL: 59.07
Triglycerides: 90 mg/dL (ref 0.0–149.0)
VLDL: 18 mg/dL (ref 0.0–40.0)

## 2016-07-11 LAB — TSH: TSH: 2.77 u[IU]/mL (ref 0.35–4.50)

## 2016-07-11 MED ORDER — ALPRAZOLAM 0.25 MG PO TABS: 0.2500 mg | ORAL_TABLET | Freq: Two times a day (BID) | ORAL | 3 refills | Status: DC | PRN

## 2016-07-11 MED ORDER — AZELASTINE HCL 0.1 % NA SOLN: 1.0000 | Freq: Two times a day (BID) | NASAL | 11 refills | Status: DC

## 2016-07-11 NOTE — Progress Notes (Signed)
Subjective:  Patient ID: Alexandra Swanson, female    DOB: Dec 23, 1965  Age: 51 y.o. MRN: [id number]  CC: Hypertension (Labs, medication refill ); Allergies (running nose); Hyperlipidemia; and Annual Exam   HPI [name] presents for a CPX.  She complains of runny nose despite using Flonase, taking Singulair and an oral antihistamine. She complains of persistent episodes of anxiety and wants a refill on Xanax. 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 Fortunately, she has recovered and has no ongoing symptoms of chest pain, shortness of breath, edema, or fatigue.  Outpatient Medications Prior to Visit  Medication Sig Dispense Refill  . aspirin 81 MG tablet Take 81 mg by mouth daily.     Marland Kitchen atorvastatin (LIPITOR) 80 MG tablet Take 1 tablet (80 mg total) by mouth daily at 6 PM. 60 tablet 1  . Biotin 1000 MCG tablet Take 1,000 mcg by mouth daily.    . carvedilol (COREG) 12.5 MG tablet Take 0.5 tablets (6.25 mg total) by mouth 2 (two) times daily with a meal. (Patient taking differently: Take 12.5 mg by mouth 2 (two) times daily with a meal. ) 60 tablet 1  . cyanocobalamin (,VITAMIN B-12,) 1000 MCG/ML injection Give 1035mcg/mL (1vial) once a week for 3 weeks. Then switch to once a month for 6 months.  Attach18G needle to 59mL luer lok syringe to draw up medication. Take off 18G needle and attach 30G 1 inch needle to do IM injection. Dispose of needle properly. 9 mL 0  . fexofenadine (ALLEGRA) 180 MG tablet Take 180 mg by mouth daily.    . montelukast (SINGULAIR) 10 MG tablet Take 1 tablet (10 mg total) by mouth at bedtime. (Patient taking differently: Take 10 mg by mouth daily. ) 90 tablet 3  . NEEDLE, DISP, 18 G 18G X 1" MISC Give 1063mcg/mL (1vial) once a week for 3 weeks. Then switch to once a month for 6 months.  Attach18G needle to 59mL luer lok syringe to draw up medication. Take off 18G needle and attach 30G 1 inch needle to do IM injection. Dispose of  needle properly. 9 each 0  . NEEDLE, DISP, 30 G 30G X 1" MISC Give 1082mcg/mL (1vial) once a week for 3 weeks. Then switch to once a month for 6 months.  Attach18G needle to 52mL luer lok syringe to draw up medication. Take off 18G needle and attach 30G 1 inch needle to do IM injection. Dispose of needle properly. 9 each 0  . norethindrone-ethinyl estradiol-iron (LOESTRIN FE 1.5/30) 1.5-30 MG-MCG tablet Take 1 tablet by mouth daily. 1 Package 11  . Syringe, Disposable, (B-D SYRINGE LUER-LOK 3CC) 3 ML MISC Give 1082mcg/mL (1vial) once a week for 3 weeks. Then switch to once a month for 6 months.  Attach18G needle to 75mL luer lok syringe to draw up medication. Take off 18G needle and attach 30G 1 inch needle to do IM injection. Dispose of needle properly. 9 each 0  . ticagrelor (BRILINTA) 90 MG TABS tablet Take 1 tablet (90 mg total) by mouth 2 (two) times daily. 60 tablet 0  . ALPRAZolam (XANAX) 0.25 MG tablet Take 1 tablet (0.25 mg total) by mouth 2 (two) times daily as needed. for anxiety 30 tablet 3  . fluticasone (FLONASE) 50 MCG/ACT nasal spray Place 2 sprays into both nostrils daily as needed for allergies or rhinitis.    Marland Kitchen losartan (COZAAR) 50 MG tablet Take 0.5 tablets (25 mg total)  by mouth daily. (Patient taking differently: Take 50 mg by mouth daily. 2 tablets per day) 30 tablet 1   Facility-Administered Medications Prior to Visit  Medication Dose Route Frequency Provider Last Rate Last Dose  . gadopentetate dimeglumine (MAGNEVIST) injection 13 mL  13 mL Intravenous Once PRN Anson Fret, MD        ROS Review of Systems  Constitutional: Negative.  Negative for chills, fatigue and unexpected weight change.  HENT: Positive for postnasal drip and rhinorrhea. Negative for congestion, nosebleeds, sinus pressure, sneezing, sore throat, tinnitus and trouble swallowing.   Eyes: Negative.  Negative for visual disturbance.  Respiratory: Negative for cough, chest tightness, shortness of breath,  wheezing and stridor.   Cardiovascular: Negative for chest pain, palpitations and leg swelling.  Gastrointestinal: Negative for abdominal pain, blood in stool, constipation, diarrhea, nausea and vomiting.  Endocrine: Negative.   Genitourinary: Negative.  Negative for difficulty urinating and dysuria.  Musculoskeletal: Negative.  Negative for back pain, myalgias and neck pain.  Skin: Negative.  Negative for color change and rash.  Allergic/Immunologic: Negative.   Neurological: Negative.  Negative for dizziness and weakness.  Hematological: Negative for adenopathy. Does not bruise/bleed easily.  Psychiatric/Behavioral: Negative for decreased concentration, dysphoric mood, sleep disturbance and suicidal ideas. The patient is nervous/anxious.     Objective:  BP 120/82   Pulse 78   Temp 98 F (36.7 C) (Oral)   Resp 16   Ht 5\' 7"  (1.702 m)   Wt 142 lb 8 oz (64.6 kg)   SpO2 99%   BMI 22.32 kg/m   BP Readings from Last 3 Encounters:  07/11/16 120/82  04/17/16 138/73  02/22/16 138/60    Wt Readings from Last 3 Encounters:  07/11/16 142 lb 8 oz (64.6 kg)  04/17/16 142 lb 9.6 oz (64.7 kg)  05/18/16 142 lb 6.7 oz (64.6 kg)    Physical Exam  Constitutional: She is oriented to person, place, and time. No distress.  HENT:  Head: Normocephalic and atraumatic.  Nose: Rhinorrhea present. No mucosal edema. No epistaxis. Right sinus exhibits no maxillary sinus tenderness and no frontal sinus tenderness. Left sinus exhibits no maxillary sinus tenderness and no frontal sinus tenderness.  Mouth/Throat: Oropharynx is clear and moist. No oropharyngeal exudate.  Eyes: Conjunctivae are normal. Right eye exhibits no discharge. Left eye exhibits no discharge. No scleral icterus.  Neck: Normal range of motion. Neck supple. No JVD present. No tracheal deviation present. No thyromegaly present.  Cardiovascular: Normal rate, regular rhythm, normal heart sounds and intact distal pulses.  Exam reveals no  gallop and no friction rub.   No murmur heard. Pulmonary/Chest: Effort normal and breath sounds normal. No stridor. No respiratory distress. She has no wheezes. She has no rales. She exhibits no tenderness.  Abdominal: Soft. Bowel sounds are normal. She exhibits no distension and no mass. There is no tenderness. There is no rebound and no guarding.  Musculoskeletal: Normal range of motion. She exhibits no edema, tenderness or deformity.  Lymphadenopathy:    She has no cervical adenopathy.  Neurological: She is oriented to person, place, and time.  Skin: Skin is warm and dry. No rash noted. She is not diaphoretic. No erythema. No pallor.  Vitals reviewed.   Lab Results  Component Value Date   WBC 9.6 07/11/2016   HGB 14.0 07/11/2016   HCT 42.0 07/11/2016   PLT 258.0 07/11/2016   GLUCOSE 99 07/11/2016   CHOL 113 07/11/2016   TRIG 90.0 07/11/2016   HDL  53.90 07/11/2016   LDLCALC 41 07/11/2016   ALT 22 07/11/2016   AST 20 07/11/2016   NA 137 07/11/2016   K 5.1 07/11/2016   CL 103 07/11/2016   CREATININE 0.70 07/11/2016   BUN 11 07/11/2016   CO2 27 07/11/2016   TSH 2.77 07/11/2016   INR 1.09 12/31/2015   HGBA1C 5.4 12/31/2015    No results found.  Assessment & Plan:   Dorotea was seen today for hypertension, allergies, hyperlipidemia and annual exam.  Diagnoses and all orders for this visit:  ANXIETY DISORDER, GENERALIZED -     ALPRAZolam (XANAX) 0.25 MG tablet; Take 1 tablet (0.25 mg total) by mouth 2 (two) times daily as needed. for anxiety  Chronic seasonal allergic rhinitis due to pollen -     azelastine (ASTELIN) 0.1 % nasal spray; Place 1 spray into both nostrils 2 (two) times daily. Use in each nostril as directed  Routine general medical examination at a health care facility- Exam completed, labs ordered and reviewed, vaccines reviewed and updated, her Pap smear and mammogram are up-to-date, patient education material was given. -     Lipid panel; Future -      Comprehensive metabolic panel; Future -     CBC with Differential/Platelet; Future -     TSH; Future   I have discontinued Ms. Ciesla's fluticasone and losartan. I am also having her start on azelastine. Additionally, I am having her maintain her aspirin, montelukast, fexofenadine, Biotin, atorvastatin, norethindrone-ethinyl estradiol-iron, ticagrelor, carvedilol, cyanocobalamin, NEEDLE (DISP) 18 G, NEEDLE (DISP) 30 G, Syringe (Disposable), valsartan, hydrochlorothiazide, and ALPRAZolam.  Meds ordered this encounter  Medications  . valsartan (DIOVAN) 320 MG tablet    Sig: TK 1 T PO D    Refill:  2  . hydrochlorothiazide (HYDRODIURIL) 12.5 MG tablet    Refill:  0  . ALPRAZolam (XANAX) 0.25 MG tablet    Sig: Take 1 tablet (0.25 mg total) by mouth 2 (two) times daily as needed. for anxiety    Dispense:  30 tablet    Refill:  3    CYCLE FILL MEDICATION. Authorization is required for next refill.  Marland Kitchen azelastine (ASTELIN) 0.1 % nasal spray    Sig: Place 1 spray into both nostrils 2 (two) times daily. Use in each nostril as directed    Dispense:  30 mL    Refill:  11     Follow-up: Return in about 6 months (around 01/11/2017).  Sanda Linger, MD

## 2016-07-11 NOTE — Patient Instructions (Signed)
Health Maintenance, Female Adopting a healthy lifestyle and getting preventive care can go a long way to promote health and wellness. Talk with your health care provider about what schedule of regular examinations is right for you. This is a good chance for you to check in with your provider about disease prevention and staying healthy. In between checkups, there are plenty of things you can do on your own. Experts have done a lot of research about which lifestyle changes and preventive measures are most likely to keep you healthy. Ask your health care provider for more information. Weight and diet Eat a healthy diet  Be sure to include plenty of vegetables, fruits, low-fat dairy products, and lean protein.  Do not eat a lot of foods high in solid fats, added sugars, or salt.  Get regular exercise. This is one of the most important things you can do for your health.  Most adults should exercise for at least 150 minutes each week. The exercise should increase your heart rate and make you sweat (moderate-intensity exercise).  Most adults should also do strengthening exercises at least twice a week. This is in addition to the moderate-intensity exercise. Maintain a healthy weight  Body mass index (BMI) is a measurement that can be used to identify possible weight problems. It estimates body fat based on height and weight. Your health care provider can help determine your BMI and help you achieve or maintain a healthy weight.  For females 50 years of age and older:  A BMI below 18.5 is considered underweight.  A BMI of 18.5 to 24.9 is normal.  A BMI of 25 to 29.9 is considered overweight.  A BMI of 30 and above is considered obese. Watch levels of cholesterol and blood lipids  You should start having your blood tested for lipids and cholesterol at 50 years of age, then have this test every 5 years.  You may need to have your cholesterol levels checked more often if:  Your lipid or  cholesterol levels are high.  You are older than 50 years of age.  You are at high risk for heart disease. Cancer screening Lung Cancer  Lung cancer screening is recommended for adults 50-42 years old who are at high risk for lung cancer because of a history of smoking.  A yearly low-dose CT scan of the lungs is recommended for people who:  Currently smoke.  Have quit within the past 15 years.  Have at least a 30-pack-year history of smoking. A pack year is smoking an average of one pack of cigarettes a day for 1 year.  Yearly screening should continue until it has been 15 years since you quit.  Yearly screening should stop if you develop a health problem that would prevent you from having lung cancer treatment. Breast Cancer  Practice breast self-awareness. This means understanding how your breasts normally appear and feel.  It also means doing regular breast self-exams. Let your health care provider know about any changes, no matter how small.  If you are in your 50s or 30s, you should have a clinical breast exam (CBE) by a health care provider every 1-3 years as part of a regular health exam.  If you are 50 or older, have a CBE every year. Also consider having a breast X-ray (mammogram) every year.  If you have a family history of breast cancer, talk to your health care provider about genetic screening.  If you are at high risk for breast cancer, talk  to your health care provider about having an MRI and a mammogram every year.  Breast cancer gene (BRCA) assessment is recommended for women who have family members with BRCA-related cancers. BRCA-related cancers include:  Breast.  Ovarian.  Tubal.  Peritoneal cancers.  Results of the assessment will determine the need for genetic counseling and BRCA1 and BRCA2 testing. Cervical Cancer  Your health care provider may recommend that you be screened regularly for cancer of the pelvic organs (ovaries, uterus, and vagina).  This screening involves a pelvic examination, including checking for microscopic changes to the surface of your cervix (Pap test). You may be encouraged to have this screening done every 3 years, beginning at age 50.  For women ages 50-65, health care providers may recommend pelvic exams and Pap testing every 3 years, or they may recommend the Pap and pelvic exam, combined with testing for human papilloma virus (HPV), every 5 years. Some types of HPV increase your risk of cervical cancer. Testing for HPV may also be done on women of any age with unclear Pap test results.  Other health care providers may not recommend any screening for nonpregnant women who are considered low risk for pelvic cancer and who do not have symptoms. Ask your health care provider if a screening pelvic exam is right for you.  If you have had past treatment for cervical cancer or a condition that could lead to cancer, you need Pap tests and screening for cancer for at least 20 years after your treatment. If Pap tests have been discontinued, your risk factors (such as having a new sexual partner) need to be reassessed to determine if screening should resume. Some women have medical problems that increase the chance of getting cervical cancer. In these cases, your health care provider may recommend more frequent screening and Pap tests. Colorectal Cancer  This type of cancer can be detected and often prevented.  Routine colorectal cancer screening usually begins at 50 years of age and continues through 50 years of age.  Your health care provider may recommend screening at an earlier age if you have risk factors for colon cancer.  Your health care provider may also recommend using home test kits to check for hidden blood in the stool.  A small camera at the end of a tube can be used to examine your colon directly (sigmoidoscopy or colonoscopy). This is done to check for the earliest forms of colorectal cancer.  Routine  screening usually begins at age 50.  Direct examination of the colon should be repeated every 5-10 years through 50 years of age. However, you may need to be screened more often if early forms of precancerous polyps or small growths are found. Skin Cancer  Check your skin from head to toe regularly.  Tell your health care provider about any new moles or changes in moles, especially if there is a change in a mole's shape or color.  Also tell your health care provider if you have a mole that is larger than the size of a pencil eraser.  Always use sunscreen. Apply sunscreen liberally and repeatedly throughout the day.  Protect yourself by wearing long sleeves, pants, a wide-brimmed hat, and sunglasses whenever you are outside. Heart disease, diabetes, and high blood pressure  High blood pressure causes heart disease and increases the risk of stroke. High blood pressure is more likely to develop in:  People who have blood pressure in the high end of the normal range (130-139/85-89 mm Hg).  People who are overweight or obese.  People who are African American.  If you are 59-24 years of age, have your blood pressure checked every 3-5 years. If you are 34 years of age or older, have your blood pressure checked every year. You should have your blood pressure measured twice-once when you are at a hospital or clinic, and once when you are not at a hospital or clinic. Record the average of the two measurements. To check your blood pressure when you are not at a hospital or clinic, you can use:  An automated blood pressure machine at a pharmacy.  A home blood pressure monitor.  If you are between 29 years and 60 years old, ask your health care provider if you should take aspirin to prevent strokes.  Have regular diabetes screenings. This involves taking a blood sample to check your fasting blood sugar level.  If you are at a normal weight and have a low risk for diabetes, have this test once  every three years after 50 years of age.  If you are overweight and have a high risk for diabetes, consider being tested at a younger age or more often. Preventing infection Hepatitis B  If you have a higher risk for hepatitis B, you should be screened for this virus. You are considered at high risk for hepatitis B if:  You were born in a country where hepatitis B is common. Ask your health care provider which countries are considered high risk.  Your parents were born in a high-risk country, and you have not been immunized against hepatitis B (hepatitis B vaccine).  You have HIV or AIDS.  You use needles to inject street drugs.  You live with someone who has hepatitis B.  You have had sex with someone who has hepatitis B.  You get hemodialysis treatment.  You take certain medicines for conditions, including cancer, organ transplantation, and autoimmune conditions. Hepatitis C  Blood testing is recommended for:  Everyone born from [birth year] through 1964.  Anyone with known risk factors for hepatitis C. Sexually transmitted infections (STIs)  You should be screened for sexually transmitted infections (STIs) including gonorrhea and chlamydia if:  You are sexually active and are younger than 50 years of age.  You are older than 50 years of age and your health care provider tells you that you are at risk for this type of infection.  Your sexual activity has changed since you were last screened and you are at an increased risk for chlamydia or gonorrhea. Ask your health care provider if you are at risk.  If you do not have HIV, but are at risk, it may be recommended that you take a prescription medicine daily to prevent HIV infection. This is called pre-exposure prophylaxis (PrEP). You are considered at risk if:  You are sexually active and do not regularly use condoms or know the HIV status of your partner(s).  You take drugs by injection.  You are sexually active with a partner  who has HIV. Talk with your health care provider about whether you are at high risk of being infected with HIV. If you choose to begin PrEP, you should first be tested for HIV. You should then be tested every 3 months for as long as you are taking PrEP. Pregnancy  If you are premenopausal and you may become pregnant, ask your health care provider about preconception counseling.  If you may become pregnant, take 400 to 800 micrograms (mcg) of folic acid  every day.  If you want to prevent pregnancy, talk to your health care provider about birth control (contraception). Osteoporosis and menopause  Osteoporosis is a disease in which the bones lose minerals and strength with aging. This can result in serious bone fractures. Your risk for osteoporosis can be identified using a bone density scan.  If you are 4 years of age or older, or if you are at risk for osteoporosis and fractures, ask your health care provider if you should be screened.  Ask your health care provider whether you should take a calcium or vitamin D supplement to lower your risk for osteoporosis.  Menopause may have certain physical symptoms and risks.  Hormone replacement therapy may reduce some of these symptoms and risks. Talk to your health care provider about whether hormone replacement therapy is right for you. Follow these instructions at home:  Schedule regular health, dental, and eye exams.  Stay current with your immunizations.  Do not use any tobacco products including cigarettes, chewing tobacco, or electronic cigarettes.  If you are pregnant, do not drink alcohol.  If you are breastfeeding, limit how much and how often you drink alcohol.  Limit alcohol intake to no more than 1 drink per day for nonpregnant women. One drink equals 12 ounces of beer, 5 ounces of wine, or 1 ounces of hard liquor.  Do not use street drugs.  Do not share needles.  Ask your health care provider for help if you need support  or information about quitting drugs.  Tell your health care provider if you often feel depressed.  Tell your health care provider if you have ever been abused or do not feel safe at home. This information is not intended to replace advice given to you by your health care provider. Make sure you discuss any questions you have with your health care provider. Document Released: 11/07/2010 Document Revised: 09/30/2015 Document Reviewed: 01/26/2015 Elsevier Interactive Patient Education  2017 [employer].

## 2016-07-11 NOTE — Progress Notes (Signed)
Pre-visit discussion using our clinic review tool. No additional management support is needed unless otherwise documented below in the visit note.  

## 2016-07-24 ENCOUNTER — Telehealth: Payer: Self-pay

## 2016-07-24 MED ORDER — MONTELUKAST SODIUM 10 MG PO TABS: 10.0000 mg | ORAL_TABLET | Freq: Every day | ORAL | 3 refills | Status: DC

## 2016-07-24 NOTE — Telephone Encounter (Signed)
[facility] is rq rf for montelukast 10 mg.   Erx sent.

## 2016-10-17 ENCOUNTER — Ambulatory Visit: Payer: Self-pay | Admitting: Neurology

## 2016-10-24 ENCOUNTER — Ambulatory Visit (INDEPENDENT_AMBULATORY_CARE_PROVIDER_SITE_OTHER): Payer: Self-pay | Admitting: Neurology

## 2016-10-24 ENCOUNTER — Encounter: Payer: Self-pay | Admitting: Neurology

## 2016-10-24 VITALS — BP 134/72 | HR 69 | Ht 67.0 in | Wt 134.2 lb

## 2016-10-24 DIAGNOSIS — E538 Deficiency of other specified B group vitamins: Secondary | ICD-10-CM

## 2016-10-24 NOTE — Progress Notes (Signed)
This was in error, patient just here for labs. No charge

## 2016-10-24 NOTE — Patient Instructions (Addendum)
Remember to drink plenty of fluid, eat healthy meals and do not skip any meals. Try to eat protein with a every meal and eat a healthy snack such as fruit or nuts in between meals. Try to keep a regular sleep-wake schedule and try to exercise daily, particularly in the form of walking, 20-30 minutes a day, if you can.   As far as your medications are concerned, I would like to suggest: Recommend 1000-2000 mcg B12 daily. recheck B12 in 12 weeks.   I would like to see you back as needed, sooner if we need to. Please call us with any interim questions, concerns, problems, updates or refill requests.   Our phone number is [phone]. We also have an after hours call service for urgent matters and there is a physician on-call for urgent questions. For any emergencies you know to call 911 or go to the nearest emergency room

## 2016-10-25 ENCOUNTER — Telehealth: Payer: Self-pay

## 2016-10-25 DIAGNOSIS — E538 Deficiency of other specified B group vitamins: Secondary | ICD-10-CM

## 2016-10-25 LAB — VITAMIN B12: Vitamin B-12: 437 pg/mL (ref 232–1245)

## 2016-10-25 NOTE — Telephone Encounter (Signed)
-----   Message from Anson Fret, MD sent at 10/25/2016 11:36 AM EDT ----- Please let patient know that B12 is improved at 437 (from 231). We need to schedule a follow up B12 for her at her [facility] near her, can you get this taken care of in 3 months?

## 2016-10-25 NOTE — Telephone Encounter (Signed)
Called pt w/ lab results and recommendations for re-check in 3 months. Verbalized understanding and appreciation for call.

## 2017-01-16 ENCOUNTER — Encounter: Payer: Self-pay | Admitting: Neurology

## 2017-01-17 ENCOUNTER — Telehealth: Payer: Self-pay | Admitting: Neurology

## 2017-01-17 NOTE — Telephone Encounter (Signed)
Alexandra Swanson, Patient would lie to get her B12 shot at get it at the lab at [facility] on [address] instead of our office. Can we do that? Alternatively can we send her the b12 shots to her home so she can administer herself? Let me know thanks!

## 2017-01-18 NOTE — Telephone Encounter (Signed)
B12 level ordered in EPIC and pt can have drawn at any [facility]. Notified pt via MyChart mssg.

## 2017-01-23 ENCOUNTER — Other Ambulatory Visit: Payer: BLUE CROSS/BLUE SHIELD

## 2017-01-23 DIAGNOSIS — E538 Deficiency of other specified B group vitamins: Secondary | ICD-10-CM

## 2017-01-25 ENCOUNTER — Telehealth: Payer: Self-pay

## 2017-01-25 LAB — VITAMIN B12: VITAMIN B 12: 871 pg/mL (ref 232–1245)

## 2017-01-25 NOTE — Telephone Encounter (Signed)
RN call patient that her b12 was 871. Pt verbalized understanding.

## 2017-01-25 NOTE — Telephone Encounter (Signed)
-----   Message from Anson Fret, MD sent at 01/25/2017  8:55 AM EDT ----- B12 beautiful at 871

## 2017-02-26 DIAGNOSIS — Z6821 Body mass index (BMI) 21.0-21.9, adult: Secondary | ICD-10-CM | POA: Diagnosis not present

## 2017-02-26 DIAGNOSIS — Z1231 Encounter for screening mammogram for malignant neoplasm of breast: Secondary | ICD-10-CM | POA: Diagnosis not present

## 2017-02-26 DIAGNOSIS — Z01419 Encounter for gynecological examination (general) (routine) without abnormal findings: Secondary | ICD-10-CM | POA: Diagnosis not present

## 2017-02-27 LAB — HM MAMMOGRAPHY

## 2017-03-08 ENCOUNTER — Encounter: Payer: Self-pay | Admitting: Internal Medicine

## 2017-03-15 ENCOUNTER — Encounter: Payer: Self-pay | Admitting: Internal Medicine

## 2017-03-22 ENCOUNTER — Encounter: Payer: Self-pay | Admitting: Internal Medicine

## 2017-03-22 ENCOUNTER — Ambulatory Visit (INDEPENDENT_AMBULATORY_CARE_PROVIDER_SITE_OTHER): Payer: 59 | Admitting: Internal Medicine

## 2017-03-22 VITALS — BP 128/80 | HR 56 | Temp 98.4°F | Resp 16 | Ht 67.0 in | Wt 137.0 lb

## 2017-03-22 DIAGNOSIS — F411 Generalized anxiety disorder: Secondary | ICD-10-CM

## 2017-03-22 DIAGNOSIS — I10 Essential (primary) hypertension: Secondary | ICD-10-CM

## 2017-03-22 MED ORDER — OLMESARTAN MEDOXOMIL-HCTZ 40-12.5 MG PO TABS: 1.0000 | ORAL_TABLET | Freq: Every day | ORAL | 1 refills | Status: DC

## 2017-03-22 MED ORDER — ALPRAZOLAM 0.25 MG PO TABS: 0.2500 mg | ORAL_TABLET | Freq: Two times a day (BID) | ORAL | 3 refills | Status: DC | PRN

## 2017-03-22 NOTE — Progress Notes (Signed)
Subjective:  Patient ID: Alexandra Swanson, female    DOB: 12-Nov-1965  Age: 51 y.o. MRN: [id number]  CC: Hypertension   HPI Denni Gallagher presents for a BP check - She tells me that she is taking carvedilol and Benicar HCT.  She tells me this regimen has controlled her blood pressure.  She has had no recent episodes of chest pain or shortness of breath.  She continues to complain of intermittent episodes of anxiety and requests a refill on Xanax.  Outpatient Medications Prior to Visit  Medication Sig Dispense Refill  . aspirin 81 MG tablet Take 81 mg by mouth daily.     Marland Kitchen atorvastatin (LIPITOR) 80 MG tablet Take 1 tablet (80 mg total) by mouth daily at 6 PM. 60 tablet 1  . azelastine (ASTELIN) 0.1 % nasal spray Place 1 spray into both nostrils 2 (two) times daily. Use in each nostril as directed 30 mL 11  . Biotin 1000 MCG tablet Take 1,000 mcg by mouth daily.    . carvedilol (COREG) 12.5 MG tablet Take 0.5 tablets (6.25 mg total) by mouth 2 (two) times daily with a meal. (Patient taking differently: Take 12.5 mg by mouth 2 (two) times daily with a meal. ) 60 tablet 1  . cyanocobalamin (,VITAMIN B-12,) 1000 MCG/ML injection Give 1072mcg/mL (1vial) once a week for 3 weeks. Then switch to once a month for 6 months.  Attach18G needle to 34mL luer lok syringe to draw up medication. Take off 18G needle and attach 30G 1 inch needle to do IM injection. Dispose of needle properly. 9 mL 0  . fexofenadine (ALLEGRA) 180 MG tablet Take 180 mg by mouth daily.    . montelukast (SINGULAIR) 10 MG tablet Take 1 tablet (10 mg total) by mouth daily. 90 tablet 3  . NEEDLE, DISP, 18 G 18G X 1" MISC Give 1037mcg/mL (1vial) once a week for 3 weeks. Then switch to once a month for 6 months.  Attach18G needle to 68mL luer lok syringe to draw up medication. Take off 18G needle and attach 30G 1 inch needle to do IM injection. Dispose of needle properly. 9 each 0  . NEEDLE, DISP, 30 G 30G X 1" MISC Give 1042mcg/mL (1vial)  once a week for 3 weeks. Then switch to once a month for 6 months.  Attach18G needle to 26mL luer lok syringe to draw up medication. Take off 18G needle and attach 30G 1 inch needle to do IM injection. Dispose of needle properly. 9 each 0  . norethindrone-ethinyl estradiol-iron (LOESTRIN FE 1.5/30) 1.5-30 MG-MCG tablet Take 1 tablet by mouth daily. 1 Package 11  . Syringe, Disposable, (B-D SYRINGE LUER-LOK 3CC) 3 ML MISC Give 1022mcg/mL (1vial) once a week for 3 weeks. Then switch to once a month for 6 months.  Attach18G needle to 32mL luer lok syringe to draw up medication. Take off 18G needle and attach 30G 1 inch needle to do IM injection. Dispose of needle properly. 9 each 0  . ticagrelor (BRILINTA) 90 MG TABS tablet Take 1 tablet (90 mg total) by mouth 2 (two) times daily. 60 tablet 0  . ALPRAZolam (XANAX) 0.25 MG tablet Take 1 tablet (0.25 mg total) by mouth 2 (two) times daily as needed. for anxiety 30 tablet 3  . hydrochlorothiazide (HYDRODIURIL) 12.5 MG tablet   0  . valsartan-hydrochlorothiazide (DIOVAN-HCT) 320-12.5 MG tablet TK 1 T PO EVERY MORNING.  3  . gadopentetate dimeglumine (MAGNEVIST) injection 13 mL      No  facility-administered medications prior to visit.     ROS Review of Systems  Constitutional: Negative.  Negative for activity change, appetite change, diaphoresis, fatigue and unexpected weight change.  HENT: Negative.   Eyes: Negative for visual disturbance.  Respiratory: Negative for cough, chest tightness, shortness of breath and wheezing.   Cardiovascular: Negative.  Negative for chest pain, palpitations and leg swelling.  Gastrointestinal: Negative for abdominal pain, constipation, diarrhea, nausea and vomiting.  Endocrine: Negative.   Musculoskeletal: Negative.  Negative for arthralgias and myalgias.  Skin: Negative.  Negative for color change and rash.  Allergic/Immunologic: Negative.   Neurological: Negative.  Negative for dizziness, weakness, light-headedness  and headaches.  Hematological: Negative for adenopathy. Does not bruise/bleed easily.  Psychiatric/Behavioral: Negative for behavioral problems, confusion, decreased concentration, dysphoric mood, self-injury, sleep disturbance and suicidal ideas. The patient is nervous/anxious.     Objective:  BP 128/80 (BP Location: Left Arm, Patient Position: Sitting, Cuff Size: Normal)   Pulse (!) 56   Temp 98.4 F (36.9 C) (Oral)   Resp 16   Ht 5\' 7"  (1.702 m)   Wt 137 lb (62.1 kg)   SpO2 98%   BMI 21.46 kg/m   BP Readings from Last 3 Encounters:  03/22/17 128/80  10/24/16 134/72  07/11/16 120/82    Wt Readings from Last 3 Encounters:  03/22/17 137 lb (62.1 kg)  10/24/16 134 lb 3.2 oz (60.9 kg)  07/11/16 142 lb 8 oz (64.6 kg)    Physical Exam  Constitutional: She is oriented to person, place, and time. No distress.  HENT:  Mouth/Throat: Oropharynx is clear and moist. No oropharyngeal exudate.  Eyes: Conjunctivae are normal. Right eye exhibits no discharge. Left eye exhibits no discharge. No scleral icterus.  Neck: Normal range of motion. Neck supple. No JVD present. No thyromegaly present.  Cardiovascular: Normal rate, regular rhythm and intact distal pulses. Exam reveals no gallop and no friction rub.  No murmur heard. Pulmonary/Chest: Effort normal and breath sounds normal. No respiratory distress. She has no wheezes. She has no rales. She exhibits no tenderness.  Abdominal: Soft. Bowel sounds are normal. She exhibits no distension and no mass. There is no tenderness. There is no rebound and no guarding.  Musculoskeletal: Normal range of motion. She exhibits no edema, tenderness or deformity.  Lymphadenopathy:    She has no cervical adenopathy.  Neurological: She is alert and oriented to person, place, and time.  Skin: Skin is warm and dry. No rash noted. She is not diaphoretic. No erythema. No pallor.  Psychiatric: She has a normal mood and affect. Her behavior is normal. Judgment  and thought content normal.  Vitals reviewed.   Lab Results  Component Value Date   WBC 9.6 07/11/2016   HGB 14.0 07/11/2016   HCT 42.0 07/11/2016   PLT 258.0 07/11/2016   GLUCOSE 99 07/11/2016   CHOL 113 07/11/2016   TRIG 90.0 07/11/2016   HDL 53.90 07/11/2016   LDLCALC 41 07/11/2016   ALT 22 07/11/2016   AST 20 07/11/2016   NA 137 07/11/2016   K 5.1 07/11/2016   CL 103 07/11/2016   CREATININE 0.70 07/11/2016   BUN 11 07/11/2016   CO2 27 07/11/2016   TSH 2.77 07/11/2016   INR 1.09 12/31/2015   HGBA1C 5.4 12/31/2015    No results found.  Assessment & Plan:   Reneshia was seen today for hypertension.  Diagnoses and all orders for this visit:  Essential hypertension- Her blood pressure is well controlled.  Will  continue the combination of carvedilol, ARB, and thiazide diuretic. -     olmesartan-hydrochlorothiazide (BENICAR HCT) 40-12.5 MG tablet; Take 1 tablet daily by mouth.  ANXIETY DISORDER, GENERALIZED- She is not willing to take an SSRI or to do psychotherapy.  Will continue Xanax at the current dose. -     ALPRAZolam (XANAX) 0.25 MG tablet; Take 1 tablet (0.25 mg total) 2 (two) times daily as needed by mouth. for anxiety   I have discontinued Darl PikesSusan Yepez's hydrochlorothiazide and valsartan-hydrochlorothiazide. I have also changed her ALPRAZolam. Additionally, I am having her start on olmesartan-hydrochlorothiazide. Lastly, I am having her maintain her aspirin, fexofenadine, Biotin, atorvastatin, norethindrone-ethinyl estradiol-iron, ticagrelor, carvedilol, cyanocobalamin, NEEDLE (DISP) 18 G, NEEDLE (DISP) 30 G, Syringe (Disposable), azelastine, and montelukast. We will stop administering gadopentetate dimeglumine.  Meds ordered this encounter  Medications  . ALPRAZolam (XANAX) 0.25 MG tablet    Sig: Take 1 tablet (0.25 mg total) 2 (two) times daily as needed by mouth. for anxiety    Dispense:  30 tablet    Refill:  3  . olmesartan-hydrochlorothiazide (BENICAR  HCT) 40-12.5 MG tablet    Sig: Take 1 tablet daily by mouth.    Dispense:  90 tablet    Refill:  1     Follow-up: No Follow-up on file.  Sanda Lingerhomas Brigitte Soderberg, MD

## 2017-03-25 ENCOUNTER — Encounter: Payer: Self-pay | Admitting: Internal Medicine

## 2017-03-25 NOTE — Patient Instructions (Signed)

## 2017-03-26 ENCOUNTER — Telehealth: Payer: Self-pay

## 2017-03-26 NOTE — Telephone Encounter (Signed)
Order [id number]

## 2017-04-18 ENCOUNTER — Encounter: Payer: Self-pay | Admitting: Internal Medicine

## 2017-04-19 MED ORDER — MONTELUKAST SODIUM 10 MG PO TABS: 10.0000 mg | ORAL_TABLET | Freq: Every day | ORAL | 3 refills | Status: DC

## 2017-05-28 ENCOUNTER — Other Ambulatory Visit: Payer: Self-pay | Admitting: Neurology

## 2017-05-28 MED ORDER — BENZONATATE 100 MG PO CAPS: 100.0000 mg | ORAL_CAPSULE | Freq: Three times a day (TID) | ORAL | 0 refills | Status: DC | PRN

## 2017-05-28 MED ORDER — AMOXICILLIN-POT CLAVULANATE 875-125 MG PO TABS: 1.0000 | ORAL_TABLET | Freq: Two times a day (BID) | ORAL | 1 refills | Status: DC

## 2017-08-15 NOTE — Telephone Encounter (Signed)
Order was cancelled due to inactivity

## 2017-08-23 ENCOUNTER — Encounter: Payer: Self-pay | Admitting: Internal Medicine

## 2017-08-23 DIAGNOSIS — J301 Allergic rhinitis due to pollen: Secondary | ICD-10-CM

## 2017-08-23 MED ORDER — AZELASTINE HCL 0.1 % NA SOLN: 1.0000 | Freq: Two times a day (BID) | NASAL | 3 refills | Status: DC

## 2017-09-27 ENCOUNTER — Telehealth: Payer: 59 | Admitting: Family

## 2017-09-27 DIAGNOSIS — B9689 Other specified bacterial agents as the cause of diseases classified elsewhere: Secondary | ICD-10-CM

## 2017-09-27 DIAGNOSIS — J028 Acute pharyngitis due to other specified organisms: Secondary | ICD-10-CM

## 2017-09-27 MED ORDER — BENZONATATE 100 MG PO CAPS: 100.0000 mg | ORAL_CAPSULE | Freq: Three times a day (TID) | ORAL | 0 refills | Status: DC | PRN

## 2017-09-27 MED ORDER — AZITHROMYCIN 250 MG PO TABS: ORAL_TABLET | ORAL | 0 refills | Status: DC

## 2017-09-27 NOTE — Progress Notes (Signed)

## 2017-10-26 ENCOUNTER — Other Ambulatory Visit: Payer: Self-pay | Admitting: Internal Medicine

## 2017-10-26 DIAGNOSIS — F411 Generalized anxiety disorder: Secondary | ICD-10-CM

## 2017-11-13 DIAGNOSIS — N951 Menopausal and female climacteric states: Secondary | ICD-10-CM | POA: Diagnosis not present

## 2017-11-21 DIAGNOSIS — I2510 Atherosclerotic heart disease of native coronary artery without angina pectoris: Secondary | ICD-10-CM | POA: Diagnosis not present

## 2017-11-23 DIAGNOSIS — I2510 Atherosclerotic heart disease of native coronary artery without angina pectoris: Secondary | ICD-10-CM | POA: Diagnosis not present

## 2017-11-23 DIAGNOSIS — I10 Essential (primary) hypertension: Secondary | ICD-10-CM | POA: Diagnosis not present

## 2017-11-23 DIAGNOSIS — Z0189 Encounter for other specified special examinations: Secondary | ICD-10-CM | POA: Diagnosis not present

## 2017-11-30 ENCOUNTER — Other Ambulatory Visit: Payer: Self-pay | Admitting: Neurology

## 2017-11-30 ENCOUNTER — Other Ambulatory Visit: Payer: Self-pay

## 2017-11-30 DIAGNOSIS — E538 Deficiency of other specified B group vitamins: Secondary | ICD-10-CM

## 2017-12-03 LAB — B12 AND FOLATE PANEL
FOLATE: 7.1 ng/mL (ref >3.0)
Vitamin B-12: 1238 pg/mL (ref 232–1245)

## 2017-12-03 LAB — METHYLMALONIC ACID, SERUM: Methylmalonic Acid: 117 nmol/L (ref 0–378)

## 2017-12-03 LAB — SPECIMEN STATUS REPORT

## 2018-01-30 DIAGNOSIS — Z23 Encounter for immunization: Secondary | ICD-10-CM | POA: Diagnosis not present

## 2018-02-07 ENCOUNTER — Other Ambulatory Visit: Payer: Self-pay | Admitting: Internal Medicine

## 2018-02-20 ENCOUNTER — Other Ambulatory Visit: Payer: Self-pay | Admitting: Internal Medicine

## 2018-03-25 ENCOUNTER — Encounter: Payer: Self-pay | Admitting: Internal Medicine

## 2018-03-25 ENCOUNTER — Ambulatory Visit: Payer: 59 | Admitting: Internal Medicine

## 2018-03-25 ENCOUNTER — Other Ambulatory Visit (INDEPENDENT_AMBULATORY_CARE_PROVIDER_SITE_OTHER): Payer: 59

## 2018-03-25 VITALS — BP 122/72 | HR 61 | Temp 97.9°F | Ht 67.0 in | Wt 135.2 lb

## 2018-03-25 DIAGNOSIS — R739 Hyperglycemia, unspecified: Secondary | ICD-10-CM | POA: Insufficient documentation

## 2018-03-25 DIAGNOSIS — I10 Essential (primary) hypertension: Secondary | ICD-10-CM | POA: Diagnosis not present

## 2018-03-25 DIAGNOSIS — E785 Hyperlipidemia, unspecified: Secondary | ICD-10-CM | POA: Diagnosis not present

## 2018-03-25 DIAGNOSIS — I2109 ST elevation (STEMI) myocardial infarction involving other coronary artery of anterior wall: Secondary | ICD-10-CM

## 2018-03-25 DIAGNOSIS — Z0000 Encounter for general adult medical examination without abnormal findings: Secondary | ICD-10-CM

## 2018-03-25 DIAGNOSIS — Z1211 Encounter for screening for malignant neoplasm of colon: Secondary | ICD-10-CM

## 2018-03-25 LAB — HEMOGLOBIN A1C: Hgb A1c MFr Bld: 5.7 % (ref 4.6–6.5)

## 2018-03-25 LAB — LIPID PANEL
CHOLESTEROL: 131 mg/dL (ref 0–200)
HDL: 65.1 mg/dL (ref >39.00)
LDL Cholesterol: 49 mg/dL (ref 0–99)
NONHDL: 65.74
Total CHOL/HDL Ratio: 2
Triglycerides: 82 mg/dL (ref 0.0–149.0)
VLDL: 16.4 mg/dL (ref 0.0–40.0)

## 2018-03-25 LAB — COMPREHENSIVE METABOLIC PANEL
ALBUMIN: 4.6 g/dL (ref 3.5–5.2)
ALK PHOS: 53 U/L (ref 39–117)
ALT: 28 U/L (ref 0–35)
AST: 24 U/L (ref 0–37)
BILIRUBIN TOTAL: 0.5 mg/dL (ref 0.2–1.2)
BUN: 12 mg/dL (ref 6–23)
CO2: 30 mEq/L (ref 19–32)
CREATININE: 0.52 mg/dL (ref 0.40–1.20)
Calcium: 9.6 mg/dL (ref 8.4–10.5)
Chloride: 100 mEq/L (ref 96–112)
GFR: 131.85 mL/min (ref >60.00)
GLUCOSE: 105 mg/dL — AB (ref 70–99)
Potassium: 4.2 mEq/L (ref 3.5–5.1)
SODIUM: 138 meq/L (ref 135–145)
TOTAL PROTEIN: 7.5 g/dL (ref 6.0–8.3)

## 2018-03-25 LAB — CBC WITH DIFFERENTIAL/PLATELET
BASOS PCT: 0.3 % (ref 0.0–3.0)
Basophils Absolute: 0 10*3/uL (ref 0.0–0.1)
EOS PCT: 1.2 % (ref 0.0–5.0)
Eosinophils Absolute: 0.1 10*3/uL (ref 0.0–0.7)
HEMATOCRIT: 38.6 % (ref 36.0–46.0)
HEMOGLOBIN: 12.8 g/dL (ref 12.0–15.0)
LYMPHS PCT: 27.6 % (ref 12.0–46.0)
Lymphs Abs: 2 10*3/uL (ref 0.7–4.0)
MCHC: 33.2 g/dL (ref 30.0–36.0)
MCV: 90.4 fl (ref 78.0–100.0)
MONO ABS: 0.5 10*3/uL (ref 0.1–1.0)
MONOS PCT: 6.4 % (ref 3.0–12.0)
Neutro Abs: 4.7 10*3/uL (ref 1.4–7.7)
Neutrophils Relative %: 64.5 % (ref 43.0–77.0)
Platelets: 234 10*3/uL (ref 150.0–400.0)
RBC: 4.27 Mil/uL (ref 3.87–5.11)
RDW: 13.2 % (ref 11.5–15.5)
WBC: 7.3 10*3/uL (ref 4.0–10.5)

## 2018-03-25 LAB — TSH: TSH: 2.96 u[IU]/mL (ref 0.35–4.50)

## 2018-03-25 NOTE — Progress Notes (Signed)
Subjective:  Patient ID: Alexandra Swanson, female    DOB: February 28, 1966  Age: 52 y.o. MRN: [id number]  CC: Hypertension; Hyperlipidemia; and Annual Exam   HPI [name] presents for a CPX.  She tells me her blood pressure has recently been well controlled.  She denies any episodes of CP, DOE, palpitations, edema, or fatigue.  She has had a painful rash over the posterior aspect of her left buttock and leg for the last 5 days.  Her dermatologist is treating her for shingles with Valtrex.  Outpatient Medications Prior to Visit  Medication Sig Dispense Refill  . ALPRAZolam (XANAX) 0.25 MG tablet TAKE ONE TABLET BY MOUTH TWICE A DAY AS NEEDED FOR ANXIETY 30 tablet 2  . aspirin 81 MG tablet Take 81 mg by mouth daily.     Marland Kitchen atorvastatin (LIPITOR) 80 MG tablet Take 1 tablet (80 mg total) by mouth daily at 6 PM. 60 tablet 1  . azelastine (ASTELIN) 0.1 % nasal spray Place 1 spray into both nostrils 2 (two) times daily. Use in each nostril as directed 30 mL 3  . Biotin 1000 MCG tablet Take 1,000 mcg by mouth daily.    . fexofenadine (ALLEGRA) 180 MG tablet Take 180 mg by mouth daily.    . montelukast (SINGULAIR) 10 MG tablet TAKE 1 TABLET BY MOUTH  DAILY 90 tablet 3  . carvedilol (COREG) 12.5 MG tablet Take 0.5 tablets (6.25 mg total) by mouth 2 (two) times daily with a meal. (Patient taking differently: Take 12.5 mg by mouth 2 (two) times daily with a meal. ) 60 tablet 1  . olmesartan-hydrochlorothiazide (BENICAR HCT) 40-12.5 MG tablet Take 1 tablet daily by mouth. 90 tablet 1  . amoxicillin-clavulanate (AUGMENTIN) 875-125 MG tablet Take 1 tablet by mouth 2 (two) times daily. 14 tablet 1  . azithromycin (ZITHROMAX) 250 MG tablet Take 2 tabs now and then 1 daily for 4 days 6 tablet 0  . benzonatate (TESSALON PERLES) 100 MG capsule Take 1-2 capsules (100-200 mg total) by mouth every 8 (eight) hours as needed for cough. 30 capsule 0  . cyanocobalamin (,VITAMIN B-12,) 1000 MCG/ML injection Give  1029mcg/mL (1vial) once a week for 3 weeks. Then switch to once a month for 6 months.  Attach18G needle to 3mL luer lok syringe to draw up medication. Take off 18G needle and attach 30G 1 inch needle to do IM injection. Dispose of needle properly. 9 mL 0  . NEEDLE, DISP, 18 G 18G X 1" MISC Give 1042mcg/mL (1vial) once a week for 3 weeks. Then switch to once a month for 6 months.  Attach18G needle to 3mL luer lok syringe to draw up medication. Take off 18G needle and attach 30G 1 inch needle to do IM injection. Dispose of needle properly. 9 each 0  . NEEDLE, DISP, 30 G 30G X 1" MISC Give 1064mcg/mL (1vial) once a week for 3 weeks. Then switch to once a month for 6 months.  Attach18G needle to 3mL luer lok syringe to draw up medication. Take off 18G needle and attach 30G 1 inch needle to do IM injection. Dispose of needle properly. 9 each 0  . norethindrone-ethinyl estradiol-iron (LOESTRIN FE 1.5/30) 1.5-30 MG-MCG tablet Take 1 tablet by mouth daily. 1 Package 11  . Syringe, Disposable, (B-D SYRINGE LUER-LOK 3CC) 3 ML MISC Give 1020mcg/mL (1vial) once a week for 3 weeks. Then switch to once a month for 6 months.  Attach18G needle to 3mL luer lok syringe to draw up  medication. Take off 18G needle and attach 30G 1 inch needle to do IM injection. Dispose of needle properly. 9 each 0  . ticagrelor (BRILINTA) 90 MG TABS tablet Take 1 tablet (90 mg total) by mouth 2 (two) times daily. 60 tablet 0   No facility-administered medications prior to visit.     ROS Review of Systems  Constitutional: Negative.  Negative for diaphoresis and fatigue.  HENT: Negative.   Eyes: Negative.   Respiratory: Negative.  Negative for cough, chest tightness, shortness of breath and wheezing.   Cardiovascular: Negative for chest pain, palpitations and leg swelling.  Gastrointestinal: Negative for abdominal pain, blood in stool, constipation, diarrhea, nausea and vomiting.  Endocrine: Negative.   Genitourinary: Negative.   Negative for difficulty urinating.  Musculoskeletal: Negative.  Negative for arthralgias and myalgias.  Skin: Positive for rash. Negative for color change.  Neurological: Negative.  Negative for dizziness, weakness, light-headedness and headaches.  Hematological: Negative for adenopathy. Does not bruise/bleed easily.  Psychiatric/Behavioral: Negative.     Objective:  BP 122/72 (BP Location: Left Arm, Patient Position: Sitting, Cuff Size: Normal)   Pulse 61   Temp 97.9 F (36.6 C) (Oral)   Ht 5\' 7"  (1.702 m)   Wt 135 lb 4 oz (61.3 kg)   LMP 03/25/2018   SpO2 99%   BMI 21.18 kg/m   BP Readings from Last 3 Encounters:  03/25/18 122/72  03/22/17 128/80  10/24/16 134/72    Wt Readings from Last 3 Encounters:  03/25/18 135 lb 4 oz (61.3 kg)  03/22/17 137 lb (62.1 kg)  10/24/16 134 lb 3.2 oz (60.9 kg)    Physical Exam  Constitutional: She is oriented to person, place, and time. No distress.  HENT:  Mouth/Throat: Oropharynx is clear and moist. No oropharyngeal exudate.  Eyes: Conjunctivae are normal. No scleral icterus.  Neck: Normal range of motion. Neck supple. No JVD present. No thyromegaly present.  Cardiovascular: Normal rate, regular rhythm and normal heart sounds. Exam reveals no gallop.  No murmur heard. Pulmonary/Chest: Effort normal and breath sounds normal. No respiratory distress. She has no wheezes. She has no rales.  Abdominal: Soft. Bowel sounds are normal. She exhibits no mass. There is no hepatosplenomegaly. There is no tenderness.  Musculoskeletal: Normal range of motion. She exhibits no edema, tenderness or deformity.  Lymphadenopathy:    She has no cervical adenopathy.  Neurological: She is alert and oriented to person, place, and time.  Skin: Skin is warm and dry. Rash noted. She is not diaphoretic. No erythema. No pallor.  Groups of faint vesicles on erythematous base over her left buttocks and left posterior thigh.  Vitals reviewed.   Lab Results    Component Value Date   WBC 7.3 03/25/2018   HGB 12.8 03/25/2018   HCT 38.6 03/25/2018   PLT 234.0 03/25/2018   GLUCOSE 105 (H) 03/25/2018   CHOL 131 03/25/2018   TRIG 82.0 03/25/2018   HDL 65.10 03/25/2018   LDLCALC 49 03/25/2018   ALT 28 03/25/2018   AST 24 03/25/2018   NA 138 03/25/2018   K 4.2 03/25/2018   CL 100 03/25/2018   CREATININE 0.52 03/25/2018   BUN 12 03/25/2018   CO2 30 03/25/2018   TSH 2.96 03/25/2018   INR 1.09 12/31/2015   HGBA1C 5.7 03/25/2018    No results found.  Assessment & Plan:   Shikia was seen today for hypertension, hyperlipidemia and annual exam.  Diagnoses and all orders for this visit:  Acute MI, anterolateral  wall, initial episode of care [facility])  Essential hypertension- Her blood pressure is well controlled.  Electrolytes and renal function are normal.  Will continue the combination of telmisartan, hydrochlorothiazide, and carvedilol. -     CBC with Differential/Platelet; Future -     Comprehensive metabolic panel; Future -     TSH; Future -     olmesartan-hydrochlorothiazide (BENICAR HCT) 40-12.5 MG tablet; Take 1 tablet by mouth daily. -     carvedilol (COREG) 12.5 MG tablet; Take 0.5 tablets (6.25 mg total) by mouth 2 (two) times daily with a meal.  Routine general medical examination at a health care facility- Exam completed, labs reviewed, vaccines reviewed and updated, Cologuard ordered to screen for colon cancer, Pap is up-to-date, mammogram will be done soon, patient education material was given. -     Lipid panel; Future  Hyperlipidemia with target LDL less than 160- She has achieved her LDL goal and is doing well on the statin. -     Comprehensive metabolic panel; Future -     TSH; Future  Hyperglycemia- She has mild prediabetes.  She was encouraged to improve her lifestyle modifications. -     Hemoglobin A1c; Future   I have discontinued Alexandra Swanson "[name] Kubitz"'s norethindrone-ethinyl estradiol-iron, ticagrelor,  cyanocobalamin, NEEDLE (DISP) 18 G, NEEDLE (DISP) 30 G, Syringe (Disposable), amoxicillin-clavulanate, azithromycin, and benzonatate. I have also changed her olmesartan-hydrochlorothiazide. Additionally, I am having her maintain her aspirin, fexofenadine, Biotin, atorvastatin, azelastine, ALPRAZolam, montelukast, and carvedilol.  Meds ordered this encounter  Medications  . olmesartan-hydrochlorothiazide (BENICAR HCT) 40-12.5 MG tablet    Sig: Take 1 tablet by mouth daily.    Dispense:  90 tablet    Refill:  1  . carvedilol (COREG) 12.5 MG tablet    Sig: Take 0.5 tablets (6.25 mg total) by mouth 2 (two) times daily with a meal.    Dispense:  60 tablet    Refill:  1     Follow-up: No follow-ups on file.  Sanda Linger, MD

## 2018-03-26 MED ORDER — OLMESARTAN MEDOXOMIL-HCTZ 40-12.5 MG PO TABS: 1.0000 | ORAL_TABLET | Freq: Every day | ORAL | 1 refills | Status: DC

## 2018-03-26 MED ORDER — CARVEDILOL 12.5 MG PO TABS: 6.2500 mg | ORAL_TABLET | Freq: Two times a day (BID) | ORAL | 1 refills | Status: DC

## 2018-03-28 NOTE — Addendum Note (Signed)
Addended by: Verlan Friends on: 03/28/2018 04:31 PM   Modules accepted: Orders

## 2018-05-14 DIAGNOSIS — Z01419 Encounter for gynecological examination (general) (routine) without abnormal findings: Secondary | ICD-10-CM | POA: Diagnosis not present

## 2018-05-14 DIAGNOSIS — Z124 Encounter for screening for malignant neoplasm of cervix: Secondary | ICD-10-CM | POA: Diagnosis not present

## 2018-05-14 DIAGNOSIS — Z1231 Encounter for screening mammogram for malignant neoplasm of breast: Secondary | ICD-10-CM | POA: Diagnosis not present

## 2018-05-14 LAB — HM PAP SMEAR

## 2018-05-14 LAB — HM MAMMOGRAPHY

## 2018-05-15 DIAGNOSIS — H9202 Otalgia, left ear: Secondary | ICD-10-CM | POA: Diagnosis not present

## 2018-07-28 ENCOUNTER — Other Ambulatory Visit: Payer: Self-pay | Admitting: Cardiology

## 2018-07-28 DIAGNOSIS — I10 Essential (primary) hypertension: Secondary | ICD-10-CM

## 2018-07-29 ENCOUNTER — Other Ambulatory Visit: Payer: Self-pay | Admitting: Cardiology

## 2018-07-29 ENCOUNTER — Other Ambulatory Visit: Payer: Self-pay | Admitting: Internal Medicine

## 2018-07-29 ENCOUNTER — Encounter: Payer: Self-pay | Admitting: Internal Medicine

## 2018-07-29 DIAGNOSIS — I10 Essential (primary) hypertension: Secondary | ICD-10-CM

## 2018-07-29 DIAGNOSIS — F411 Generalized anxiety disorder: Secondary | ICD-10-CM

## 2018-07-29 MED ORDER — ALPRAZOLAM 0.25 MG PO TABS: ORAL_TABLET | ORAL | 2 refills | Status: DC

## 2018-07-30 ENCOUNTER — Other Ambulatory Visit: Payer: Self-pay | Admitting: Cardiology

## 2018-07-30 DIAGNOSIS — I10 Essential (primary) hypertension: Secondary | ICD-10-CM

## 2018-10-04 DIAGNOSIS — L739 Follicular disorder, unspecified: Secondary | ICD-10-CM | POA: Diagnosis not present

## 2018-10-04 DIAGNOSIS — N959 Unspecified menopausal and perimenopausal disorder: Secondary | ICD-10-CM | POA: Diagnosis not present

## 2018-11-05 ENCOUNTER — Telehealth: Payer: Self-pay | Admitting: Cardiology

## 2018-11-05 NOTE — Telephone Encounter (Signed)
Patient has been having episodes of low blood pressure, dizziness, otherwise asymptomatic and continues to remain active.  Advised her to decrease the dose of carvedilol from 12.5 mg to 6.25 mg twice daily and also decrease Benicar HCT from 20/12.5 mg to 1/2 tablet in the morning.  She has an appointment coming up in 3 weeks, will evaluate her again soon.  No chest pain, no dyspnea.  No syncope.  Otherwise feels well, no change in weight.

## 2018-11-13 ENCOUNTER — Other Ambulatory Visit: Payer: Self-pay | Admitting: Cardiology

## 2018-11-29 ENCOUNTER — Encounter: Payer: Self-pay | Admitting: Cardiology

## 2018-11-29 ENCOUNTER — Other Ambulatory Visit: Payer: Self-pay

## 2018-11-29 ENCOUNTER — Ambulatory Visit (INDEPENDENT_AMBULATORY_CARE_PROVIDER_SITE_OTHER): Payer: 59 | Admitting: Cardiology

## 2018-11-29 VITALS — BP 132/82 | HR 70 | Ht 67.0 in | Wt 139.0 lb

## 2018-11-29 DIAGNOSIS — I10 Essential (primary) hypertension: Secondary | ICD-10-CM | POA: Diagnosis not present

## 2018-11-29 DIAGNOSIS — E785 Hyperlipidemia, unspecified: Secondary | ICD-10-CM

## 2018-11-29 DIAGNOSIS — I2510 Atherosclerotic heart disease of native coronary artery without angina pectoris: Secondary | ICD-10-CM | POA: Diagnosis not present

## 2018-11-29 MED ORDER — OLMESARTAN MEDOXOMIL 20 MG PO TABS: 20.0000 mg | ORAL_TABLET | Freq: Every day | ORAL | 1 refills | Status: DC

## 2018-11-29 MED ORDER — CARVEDILOL 6.25 MG PO TABS: 6.2500 mg | ORAL_TABLET | Freq: Two times a day (BID) | ORAL | 3 refills | Status: DC

## 2018-11-29 NOTE — Progress Notes (Signed)
Primary Physician/Referring:  Etta Grandchild, MD  Patient ID: Alexandra Swanson, female    DOB: 1965-09-30, 53 y.o.   MRN: [id number]  Chief Complaint  Patient presents with  . Coronary Artery Disease    1 year   . Follow-up  . Dizziness   HPI:    HPI: Alexandra Swanson  is a 53 y.o. with acute anterior ST elevation on 12/31/2015 S/P primary angioplasty to mid LAD and very mild residual circumflex disease presents here for annual visit.  For the past few weeks he has noticed dizziness and when she checked her blood pressure, it was very low.  She has reduced Coreg and also Benicar HCT dose by half.  Otherwise she is doing well, has not had any recurrence of angina, has been walking some and exercising some but remains active at work without any dyspnea, dizziness or palpitations or syncope.  No leg edema.  Past Medical History:  Diagnosis Date  . Anxiety   . Heart attack (HCC) 12/31/2015  . Hyperlipidemia   . Hypertension    Past Surgical History:  Procedure Laterality Date  . CARDIAC CATHETERIZATION N/A 12/31/2015   Procedure: Left Heart Cath and Coronary Angiography;  Surgeon: Yates Decamp, MD;  Location: [facility] INVASIVE CV LAB;  Service: Cardiovascular;  Laterality: N/A;  . CARDIAC CATHETERIZATION N/A 12/31/2015   Procedure: Coronary Stent Intervention;  Surgeon: Yates Decamp, MD;  Location: [facility] INVASIVE CV LAB;  Service: Cardiovascular;  Laterality: N/A;  . CESAREAN SECTION  1999  . CHOLECYSTECTOMY  2010   Social History   Socioeconomic History  . Marital status: Divorced    Spouse name: Not on file  . Number of children: 1  . Years of education: BS  . Highest education level: Not on file  Occupational History  . Occupation: Merchandiser, retail  Social Needs  . Financial resource strain: Not on file  . Food insecurity    Worry: Not on file    Inability: Not on file  . Transportation needs    Medical: Not on file    Non-medical: Not on file  Tobacco Use  . Smoking status: Never Smoker   . Smokeless tobacco: Never Used  Substance and Sexual Activity  . Alcohol use: Yes    Alcohol/week: 12.0 standard drinks    Types: 7 Glasses of wine, 5 Cans of beer per week    Comment: social  . Drug use: No  . Sexual activity: Yes    Birth control/protection: Pill  Lifestyle  . Physical activity    Days per week: Not on file    Minutes per session: Not on file  . Stress: Not on file  Relationships  . Social Musician on phone: Not on file    Gets together: Not on file    Attends religious service: Not on file    Active member of club or organization: Not on file    Attends meetings of clubs or organizations: Not on file    Relationship status: Not on file  . Intimate partner violence    Fear of current or ex partner: Not on file    Emotionally abused: Not on file    Physically abused: Not on file    Forced sexual activity: Not on file  Other Topics Concern  . Not on file  Social History Narrative   Lives with son   Caffeine use:  Drinks 1 cup coffee per day   1 diet soda per day  ROS  Review of Systems  Constitution: Negative for chills, decreased appetite, malaise/fatigue and weight gain.  Cardiovascular: Negative for dyspnea on exertion, leg swelling and syncope.  Endocrine: Negative for cold intolerance.  Hematologic/Lymphatic: Does not bruise/bleed easily.  Musculoskeletal: Negative for joint swelling.  Gastrointestinal: Negative for abdominal pain, anorexia, change in bowel habit, hematochezia and melena.  Neurological: Positive for dizziness. Negative for headaches and light-headedness.  Psychiatric/Behavioral: Negative for depression and substance abuse.  All other systems reviewed and are negative.  Objective  Blood pressure 132/82, pulse 70, height 5\' 7"  (1.702 m), weight 139 lb (63 kg), SpO2 98 %. Body mass index is 21.77 kg/m.   Physical Exam  Constitutional: She appears well-developed and well-nourished. No distress.  HENT:  Head:  Atraumatic.  Eyes: Conjunctivae are normal.  Neck: Neck supple. No JVD present. No thyromegaly present.  Cardiovascular: Normal rate, regular rhythm, normal heart sounds and intact distal pulses. Exam reveals no gallop.  No murmur heard. Pulmonary/Chest: Effort normal and breath sounds normal.  Abdominal: Soft. Bowel sounds are normal.  Musculoskeletal: Normal range of motion.  Neurological: She is alert.  Skin: Skin is warm and dry.  Psychiatric: She has a normal mood and affect.   Radiology: No results found.  Laboratory examination:   CMP Latest Ref Rng & Units 03/25/2018 07/11/2016 04/17/2016  Glucose 70 - 99 mg/dL 233(A) 99 77  BUN 6 - 23 mg/dL 12 11 9   Creatinine 0.40 - 1.20 mg/dL 0.76 2.26 3.33  Sodium 135 - 145 mEq/L 138 137 144  Potassium 3.5 - 5.1 mEq/L 4.2 5.1 4.6  Chloride 96 - 112 mEq/L 100 103 103  CO2 19 - 32 mEq/L 30 27 20   Calcium 8.4 - 10.5 mg/dL 9.6 9.2 9.1  Total Protein 6.0 - 8.3 g/dL 7.5 7.0 6.9  Total Bilirubin 0.2 - 1.2 mg/dL 0.5 0.7 0.6  Alkaline Phos 39 - 117 U/L 53 29(L) 34(L)  AST 0 - 37 U/L 24 20 19   ALT 0 - 35 U/L 28 22 23    CBC Latest Ref Rng & Units 03/25/2018 07/11/2016 04/17/2016  WBC 4.0 - 10.5 K/uL 7.3 9.6 4.3  Hemoglobin 12.0 - 15.0 g/dL 54.5 62.5 63.8  Hematocrit 36.0 - 46.0 % 38.6 42.0 39.2  Platelets 150.0 - 400.0 K/uL 234.0 258.0 212   Lipid Panel     Component Value Date/Time   CHOL 131 03/25/2018 1337   TRIG 82.0 03/25/2018 1337   HDL 65.10 03/25/2018 1337   CHOLHDL 2 03/25/2018 1337   VLDL 16.4 03/25/2018 1337   LDLCALC 49 03/25/2018 1337   HEMOGLOBIN A1C Lab Results  Component Value Date   HGBA1C 5.7 03/25/2018   MPG 108 12/31/2015   TSH Recent Labs    03/25/18 1337  TSH 2.96   Medications   Current Outpatient Medications  Medication Instructions  . ALPRAZolam (XANAX) 0.25 MG tablet TAKE ONE TABLET BY MOUTH TWICE A DAY AS NEEDED FOR ANXIETY  . aspirin 81 mg, Oral, Daily  . atorvastatin (LIPITOR) 80 MG tablet  TAKE 1 TABLET BY MOUTH  DAILY  . azelastine (ASTELIN) 0.1 % nasal spray 1 spray, Each Nare, 2 times daily, Use in each nostril as directed  . Biotin 1,000 mcg, Oral, Daily  . carvedilol (COREG) 6.25 mg, Oral, 2 times daily with meals  . fexofenadine (ALLEGRA) 180 mg, Oral, Daily  . montelukast (SINGULAIR) 10 MG tablet TAKE 1 TABLET BY MOUTH  DAILY  . olmesartan (BENICAR) 20 mg, Oral, Daily  Cardiac Studies:   Treadmill stress test [02/18/2016]:  The resting electrocardiogram demonstrated normal sinus rhythm, no resting arrhythmias and normal rest repolarization. The stress electrocardiogram was normal. There were no significant arrhythmias. Patient exercised on Bruce protocol for 10:01 minutes and achieved 95% of Max Predicted HR (Target HR was >85% MPHR) and 11.84 METS. Stress symptoms included fatigue and dyspnea. Normal BP response. Exercise capacity was excellent . Impression: Normal stress EKG. No significant arrhythmias. Normal BP response.  Echocardiogram [04/12/2016]: 1. Left ventricle cavity is normal in size. Visual EF is 55- 60%. Minimal apical septal hypokinesia. Normal diastolic filling pattern, normal LAP. Calculated EF 65%. 2. Left atrial cavity is normal in size. 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 3. Mild (Grade I) aortic regurgitation. 4. Mild (Grade I) mitral regurgitation. 5. Trace tricuspid regurgitation.  Coronary Angiogram [12/31/2015]: CAD S/P Stent from prox to mid 3.5x12, 3.5x18 and 3.5x12 mm Resolute DES. Mild disease Cx. Spasm in large RV branch improved with IC NTG. LVEF 35%  Assessment     ICD-10-CM   1. Coronary artery disease involving native coronary artery of native heart without angina pectoris  I25.10 EKG 12-Lead    carvedilol (COREG) 6.25 MG tablet   12/31/2015: Stenting prox to mid LAD 3.5x12, 3.5x18, and 3.5x12 mm Resolute DES.  2. Essential hypertension  I10 olmesartan (BENICAR) 20 MG tablet     carvedilol (COREG) 6.25 MG tablet  3. Mild hyperlipidemia  E78.5     EKG 11/29/2018: Normal sinus rhythm with rate of 74 bpm, borderline criteria for left atrial enlargement, normal axis, no evidence of ischemia, otherwise normal EKG.  Recommendations:   She is presently doing well, low blood pressure is probably iatrogenic, normal physical exam, normal EKG, no recurrence of angina pectoris.  Discontinue Benicar HCT and changed to plain Benicar, she can reduce the dose to 20 mg one half tablet daily as well if she notices blood pressure to be low.  Do not think she needs any further evaluation including echocardiogram.  Lipids are completely normal, continue high-dose high-intensity statin, continue aspirin indefinitely.  I'll see her back in one year.  Adrian Prows, MD, [facility] 11/30/2018, 7:19 AM [facility] [place] Pager: [phone] Office: [phone] If no answer Cell [phone]

## 2018-12-18 ENCOUNTER — Other Ambulatory Visit: Payer: Self-pay | Admitting: Cardiology

## 2018-12-18 DIAGNOSIS — I10 Essential (primary) hypertension: Secondary | ICD-10-CM

## 2018-12-27 ENCOUNTER — Other Ambulatory Visit: Payer: Self-pay | Admitting: Internal Medicine

## 2019-01-06 ENCOUNTER — Other Ambulatory Visit: Payer: Self-pay

## 2019-01-06 DIAGNOSIS — I10 Essential (primary) hypertension: Secondary | ICD-10-CM

## 2019-01-06 DIAGNOSIS — I2510 Atherosclerotic heart disease of native coronary artery without angina pectoris: Secondary | ICD-10-CM

## 2019-01-06 MED ORDER — OLMESARTAN MEDOXOMIL 20 MG PO TABS: 20.0000 mg | ORAL_TABLET | Freq: Every day | ORAL | 1 refills | Status: DC

## 2019-01-06 MED ORDER — CARVEDILOL 6.25 MG PO TABS: 6.2500 mg | ORAL_TABLET | Freq: Two times a day (BID) | ORAL | 1 refills | Status: DC

## 2019-04-17 ENCOUNTER — Encounter: Payer: Self-pay | Admitting: Internal Medicine

## 2019-04-22 ENCOUNTER — Ambulatory Visit (INDEPENDENT_AMBULATORY_CARE_PROVIDER_SITE_OTHER): Payer: 59 | Admitting: Internal Medicine

## 2019-04-22 ENCOUNTER — Encounter: Payer: Self-pay | Admitting: Internal Medicine

## 2019-04-22 ENCOUNTER — Other Ambulatory Visit (INDEPENDENT_AMBULATORY_CARE_PROVIDER_SITE_OTHER): Payer: 59

## 2019-04-22 ENCOUNTER — Other Ambulatory Visit: Payer: Self-pay

## 2019-04-22 VITALS — BP 136/84 | HR 67 | Temp 97.8°F | Resp 16 | Ht 67.0 in | Wt 139.2 lb

## 2019-04-22 DIAGNOSIS — Z0000 Encounter for general adult medical examination without abnormal findings: Secondary | ICD-10-CM | POA: Diagnosis not present

## 2019-04-22 DIAGNOSIS — J301 Allergic rhinitis due to pollen: Secondary | ICD-10-CM | POA: Diagnosis not present

## 2019-04-22 DIAGNOSIS — F411 Generalized anxiety disorder: Secondary | ICD-10-CM

## 2019-04-22 DIAGNOSIS — R739 Hyperglycemia, unspecified: Secondary | ICD-10-CM

## 2019-04-22 DIAGNOSIS — I10 Essential (primary) hypertension: Secondary | ICD-10-CM

## 2019-04-22 DIAGNOSIS — Z1211 Encounter for screening for malignant neoplasm of colon: Secondary | ICD-10-CM

## 2019-04-22 LAB — HEPATIC FUNCTION PANEL
ALT: 17 U/L (ref 0–35)
AST: 17 U/L (ref 0–37)
Albumin: 4.5 g/dL (ref 3.5–5.2)
Alkaline Phosphatase: 53 U/L (ref 39–117)
Bilirubin, Direct: 0.1 mg/dL (ref 0.0–0.3)
Total Bilirubin: 0.6 mg/dL (ref 0.2–1.2)
Total Protein: 7.3 g/dL (ref 6.0–8.3)

## 2019-04-22 LAB — URINALYSIS, ROUTINE W REFLEX MICROSCOPIC
Bilirubin Urine: NEGATIVE
Hgb urine dipstick: NEGATIVE
Leukocytes,Ua: NEGATIVE
Nitrite: NEGATIVE
RBC / HPF: NONE SEEN (ref 0–?)
Specific Gravity, Urine: 1.025 (ref 1.000–1.030)
Total Protein, Urine: NEGATIVE
Urine Glucose: NEGATIVE
Urobilinogen, UA: 1 (ref 0.0–1.0)
pH: 6 (ref 5.0–8.0)

## 2019-04-22 LAB — CBC WITH DIFFERENTIAL/PLATELET
Basophils Absolute: 0 10*3/uL (ref 0.0–0.1)
Basophils Relative: 0.3 % (ref 0.0–3.0)
Eosinophils Absolute: 0.1 10*3/uL (ref 0.0–0.7)
Eosinophils Relative: 0.8 % (ref 0.0–5.0)
HCT: 41.7 % (ref 36.0–46.0)
Hemoglobin: 13.7 g/dL (ref 12.0–15.0)
Lymphocytes Relative: 24.1 % (ref 12.0–46.0)
Lymphs Abs: 1.8 10*3/uL (ref 0.7–4.0)
MCHC: 32.8 g/dL (ref 30.0–36.0)
MCV: 91.3 fl (ref 78.0–100.0)
Monocytes Absolute: 0.6 10*3/uL (ref 0.1–1.0)
Monocytes Relative: 7.4 % (ref 3.0–12.0)
Neutro Abs: 5.1 10*3/uL (ref 1.4–7.7)
Neutrophils Relative %: 67.4 % (ref 43.0–77.0)
Platelets: 212 10*3/uL (ref 150.0–400.0)
RBC: 4.57 Mil/uL (ref 3.87–5.11)
RDW: 13.9 % (ref 11.5–15.5)
WBC: 7.6 10*3/uL (ref 4.0–10.5)

## 2019-04-22 LAB — BASIC METABOLIC PANEL
BUN: 17 mg/dL (ref 6–23)
CO2: 26 mEq/L (ref 19–32)
Calcium: 9.4 mg/dL (ref 8.4–10.5)
Chloride: 103 mEq/L (ref 96–112)
Creatinine, Ser: 0.67 mg/dL (ref 0.40–1.20)
GFR: 92.21 mL/min (ref >60.00)
Glucose, Bld: 96 mg/dL (ref 70–99)
Potassium: 4.5 mEq/L (ref 3.5–5.1)
Sodium: 138 mEq/L (ref 135–145)

## 2019-04-22 LAB — HEMOGLOBIN A1C: Hgb A1c MFr Bld: 5.6 % (ref 4.6–6.5)

## 2019-04-22 LAB — LIPID PANEL
Cholesterol: 144 mg/dL (ref 0–200)
HDL: 73.5 mg/dL (ref >39.00)
LDL Cholesterol: 57 mg/dL (ref 0–99)
NonHDL: 70.05
Total CHOL/HDL Ratio: 2
Triglycerides: 65 mg/dL (ref 0.0–149.0)
VLDL: 13 mg/dL (ref 0.0–40.0)

## 2019-04-22 LAB — TSH: TSH: 2.26 u[IU]/mL (ref 0.35–4.50)

## 2019-04-22 MED ORDER — ALPRAZOLAM 0.25 MG PO TABS: ORAL_TABLET | ORAL | 2 refills | Status: DC

## 2019-04-22 MED ORDER — MONTELUKAST SODIUM 10 MG PO TABS: 10.0000 mg | ORAL_TABLET | Freq: Every day | ORAL | 3 refills | Status: DC

## 2019-04-22 NOTE — Progress Notes (Signed)
Subjective:  Patient ID: Alexandra Swanson, female    DOB: [birth year]  Age: 53 y.o. MRN: [id number]  CC: Annual Exam, Hypertension, and Hyperlipidemia   HPI Tannah Vossler presents for a CPX.  She feels well and offers no complaints.  She tells me her blood pressure has been well controlled.  She is active and denies any recent episodes of CP, DOE, palpitations, edema, or fatigue.  Outpatient Medications Prior to Visit  Medication Sig Dispense Refill  . aspirin 81 MG tablet Take 81 mg by mouth daily.     Marland Kitchen atorvastatin (LIPITOR) 80 MG tablet TAKE 1 TABLET BY MOUTH  DAILY 90 tablet 3  . azelastine (ASTELIN) 0.1 % nasal spray Place 1 spray into both nostrils 2 (two) times daily. Use in each nostril as directed 30 mL 3  . Biotin 1000 MCG tablet Take 1,000 mcg by mouth daily.    . carvedilol (COREG) 6.25 MG tablet Take 1 tablet (6.25 mg total) by mouth 2 (two) times daily with a meal. 180 tablet 1  . fexofenadine (ALLEGRA) 180 MG tablet Take 180 mg by mouth daily.    Marland Kitchen olmesartan (BENICAR) 20 MG tablet Take 1 tablet (20 mg total) by mouth daily. 90 tablet 1  . ALPRAZolam (XANAX) 0.25 MG tablet TAKE ONE TABLET BY MOUTH TWICE A DAY AS NEEDED FOR ANXIETY 30 tablet 2  . montelukast (SINGULAIR) 10 MG tablet TAKE 1 TABLET BY MOUTH  DAILY 90 tablet 3  . olmesartan-hydrochlorothiazide (BENICAR HCT) 40-12.5 MG tablet TAKE 1 TABLET BY MOUTH IN  THE MORNING 90 tablet 3   No facility-administered medications prior to visit.    ROS Review of Systems  Constitutional: Negative.  Negative for chills, diaphoresis, fatigue and fever.  HENT: Positive for postnasal drip and rhinorrhea. Negative for congestion, facial swelling, sinus pressure, sinus pain and voice change.   Eyes: Negative.   Respiratory: Negative for cough, chest tightness, shortness of breath and wheezing.   Cardiovascular: Negative for chest pain, palpitations and leg swelling.  Gastrointestinal: Negative for abdominal pain, constipation,  diarrhea, nausea and vomiting.  Endocrine: Negative.   Genitourinary: Negative.  Negative for difficulty urinating.  Musculoskeletal: Negative for arthralgias and myalgias.  Skin: Negative.   Neurological: Negative.  Negative for dizziness, weakness, light-headedness and headaches.  Hematological: Negative for adenopathy. Does not bruise/bleed easily.  Psychiatric/Behavioral: Negative for dysphoric mood and suicidal ideas. The patient is nervous/anxious.     Objective:  BP 136/84 (BP Location: Left Arm, Patient Position: Sitting, Cuff Size: Normal)   Pulse 67   Temp 97.8 F (36.6 C) (Oral)   Resp 16   Ht 5\' 7"  (1.702 m)   Wt 139 lb 4 oz (63.2 kg)   SpO2 98%   BMI 21.81 kg/m   BP Readings from Last 3 Encounters:  04/22/19 136/84  11/29/18 132/82  03/25/18 122/72    Wt Readings from Last 3 Encounters:  04/22/19 139 lb 4 oz (63.2 kg)  11/29/18 139 lb (63 kg)  03/25/18 135 lb 4 oz (61.3 kg)    Physical Exam Vitals reviewed.  Constitutional:      Appearance: Normal appearance.  HENT:     Nose: Nose normal.     Mouth/Throat:     Mouth: Mucous membranes are moist.  Eyes:     General: No scleral icterus.    Conjunctiva/sclera: Conjunctivae normal.  Cardiovascular:     Rate and Rhythm: Normal rate and regular rhythm.     Heart sounds: No murmur.  Pulmonary:     Effort: Pulmonary effort is normal.     Breath sounds: No stridor. No wheezing, rhonchi or rales.  Abdominal:     General: Abdomen is flat. Bowel sounds are normal. There is no distension.     Palpations: Abdomen is soft. There is no hepatomegaly, splenomegaly or mass.     Tenderness: There is no abdominal tenderness.  Musculoskeletal:        General: Normal range of motion.     Cervical back: Neck supple.     Right lower leg: No edema.     Left lower leg: No edema.  Lymphadenopathy:     Cervical: No cervical adenopathy.  Skin:    General: Skin is warm and dry.  Neurological:     General: No focal  deficit present.     Mental Status: She is alert.  Psychiatric:        Mood and Affect: Mood normal.        Behavior: Behavior normal.        Thought Content: Thought content normal.        Judgment: Judgment normal.     Lab Results  Component Value Date   WBC 7.6 04/22/2019   HGB 13.7 04/22/2019   HCT 41.7 04/22/2019   PLT 212.0 04/22/2019   GLUCOSE 96 04/22/2019   CHOL 144 04/22/2019   TRIG 65.0 04/22/2019   HDL 73.50 04/22/2019   LDLCALC 57 04/22/2019   ALT 17 04/22/2019   AST 17 04/22/2019   NA 138 04/22/2019   K 4.5 04/22/2019   CL 103 04/22/2019   CREATININE 0.67 04/22/2019   BUN 17 04/22/2019   CO2 26 04/22/2019   TSH 2.26 04/22/2019   INR 1.09 12/31/2015   HGBA1C 5.6 04/22/2019    No results found.  Assessment & Plan:   Everley was seen today for annual exam, hypertension and hyperlipidemia.  Diagnoses and all orders for this visit:  Essential hypertension- Her blood pressure is adequately well controlled.  I recommend she continue the combination of carvedilol and olmesartan. -     CBC with Differential; Future -     Basic metabolic panel; Future -     TSH; Future -     Urinalysis, Routine w reflex microscopic; Future -     Hepatic function panel; Future  Routine general medical examination at a health care facility- Exam completed, labs reviewed, vaccines reviewed and updated, screening for breast cancer and cervical cancer is up-to-date, she is referred for colon cancer screening with Cologuard. -     Lipid panel; Future  Hyperglycemia- Her blood sugars are normal now. -     Basic metabolic panel; Future -     Hemoglobin A1c; Future  Non-seasonal allergic rhinitis due to pollen -     montelukast (SINGULAIR) 10 MG tablet; Take 1 tablet (10 mg total) by mouth daily.  ANXIETY DISORDER, GENERALIZED -     ALPRAZolam (XANAX) 0.25 MG tablet; TAKE ONE TABLET BY MOUTH TWICE A DAY AS NEEDED FOR ANXIETY  Colon cancer screening -     Cologuard   I have  discontinued Tonita Cong "Manuela Schwartz Seybold"'s olmesartan-hydrochlorothiazide. I have also changed her montelukast. Additionally, I am having her maintain her aspirin, fexofenadine, Biotin, azelastine, atorvastatin, olmesartan, carvedilol, and ALPRAZolam.  Meds ordered this encounter  Medications  . montelukast (SINGULAIR) 10 MG tablet    Sig: Take 1 tablet (10 mg total) by mouth daily.    Dispense:  90 tablet  Refill:  3  . ALPRAZolam (XANAX) 0.25 MG tablet    Sig: TAKE ONE TABLET BY MOUTH TWICE A DAY AS NEEDED FOR ANXIETY    Dispense:  30 tablet    Refill:  2     Follow-up: Return in about 6 months (around 10/21/2019).  Sanda Linger, MD

## 2019-04-22 NOTE — Patient Instructions (Signed)
Health Maintenance, Female Adopting a healthy lifestyle and getting preventive care are important in promoting health and wellness. Ask your health care provider about:  The right schedule for you to have regular tests and exams.  Things you can do on your own to prevent diseases and keep yourself healthy. What should I know about diet, weight, and exercise? Eat a healthy diet   Eat a diet that includes plenty of vegetables, fruits, low-fat dairy products, and lean protein.  Do not eat a lot of foods that are high in solid fats, added sugars, or sodium. Maintain a healthy weight Body mass index (BMI) is used to identify weight problems. It estimates body fat based on height and weight. Your health care provider can help determine your BMI and help you achieve or maintain a healthy weight. Get regular exercise Get regular exercise. This is one of the most important things you can do for your health. Most adults should:  Exercise for at least 150 minutes each week. The exercise should increase your heart rate and make you sweat (moderate-intensity exercise).  Do strengthening exercises at least twice a week. This is in addition to the moderate-intensity exercise.  Spend less time sitting. Even light physical activity can be beneficial. Watch cholesterol and blood lipids Have your blood tested for lipids and cholesterol at 52 years of age, then have this test every 5 years. Have your cholesterol levels checked more often if:  Your lipid or cholesterol levels are high.  You are older than 52 years of age.  You are at high risk for heart disease. What should I know about cancer screening? Depending on your health history and family history, you may need to have cancer screening at various ages. This may include screening for:  Breast cancer.  Cervical cancer.  Colorectal cancer.  Skin cancer.  Lung cancer. What should I know about heart disease, diabetes, and high blood  pressure? Blood pressure and heart disease  High blood pressure causes heart disease and increases the risk of stroke. This is more likely to develop in people who have high blood pressure readings, are of African descent, or are overweight.  Have your blood pressure checked: ? Every 3-5 years if you are 18-39 years of age. ? Every year if you are 40 years old or older. Diabetes Have regular diabetes screenings. This checks your fasting blood sugar level. Have the screening done:  Once every three years after age 40 if you are at a normal weight and have a low risk for diabetes.  More often and at a younger age if you are overweight or have a high risk for diabetes. What should I know about preventing infection? Hepatitis B If you have a higher risk for hepatitis B, you should be screened for this virus. Talk with your health care provider to find out if you are at risk for hepatitis B infection. Hepatitis C Testing is recommended for:  Everyone born from 1945 through 1965.  Anyone with known risk factors for hepatitis C. Sexually transmitted infections (STIs)  Get screened for STIs, including gonorrhea and chlamydia, if: ? You are sexually active and are younger than 52 years of age. ? You are older than 52 years of age and your health care provider tells you that you are at risk for this type of infection. ? Your sexual activity has changed since you were last screened, and you are at increased risk for chlamydia or gonorrhea. Ask your health care provider if   you are at risk.  Ask your health care provider about whether you are at high risk for HIV. Your health care provider may recommend a prescription medicine to help prevent HIV infection. If you choose to take medicine to prevent HIV, you should first get tested for HIV. You should then be tested every 3 months for as long as you are taking the medicine. Pregnancy  If you are about to stop having your period (premenopausal) and  you may become pregnant, seek counseling before you get pregnant.  Take 400 to 800 micrograms (mcg) of folic acid every day if you become pregnant.  Ask for birth control (contraception) if you want to prevent pregnancy. Osteoporosis and menopause Osteoporosis is a disease in which the bones lose minerals and strength with aging. This can result in bone fractures. If you are 65 years old or older, or if you are at risk for osteoporosis and fractures, ask your health care provider if you should:  Be screened for bone loss.  Take a calcium or vitamin D supplement to lower your risk of fractures.  Be given hormone replacement therapy (HRT) to treat symptoms of menopause. Follow these instructions at home: Lifestyle  Do not use any products that contain nicotine or tobacco, such as cigarettes, e-cigarettes, and chewing tobacco. If you need help quitting, ask your health care provider.  Do not use street drugs.  Do not share needles.  Ask your health care provider for help if you need support or information about quitting drugs. Alcohol use  Do not drink alcohol if: ? Your health care provider tells you not to drink. ? You are pregnant, may be pregnant, or are planning to become pregnant.  If you drink alcohol: ? Limit how much you use to 0-1 drink a day. ? Limit intake if you are breastfeeding.  Be aware of how much alcohol is in your drink. In the [place] one drink equals one 12 oz bottle of beer (355 mL), one 5 oz glass of wine (148 mL), or one 1 oz glass of hard liquor (44 mL). General instructions  Schedule regular health, dental, and eye exams.  Stay current with your vaccines.  Tell your health care provider if: ? You often feel depressed. ? You have ever been abused or do not feel safe at home. Summary  Adopting a healthy lifestyle and getting preventive care are important in promoting health and wellness.  Follow your health care provider's instructions about healthy  diet, exercising, and getting tested or screened for diseases.  Follow your health care provider's instructions on monitoring your cholesterol and blood pressure. This information is not intended to replace advice given to you by your health care provider. Make sure you discuss any questions you have with your health care provider. Document Released: 11/07/2010 Document Revised: 04/17/2018 Document Reviewed: 04/17/2018 Elsevier Patient Education  2020 [employer]  

## 2019-05-05 ENCOUNTER — Other Ambulatory Visit: Payer: Self-pay | Admitting: Cardiology

## 2019-05-05 DIAGNOSIS — I10 Essential (primary) hypertension: Secondary | ICD-10-CM

## 2019-05-13 ENCOUNTER — Encounter: Payer: Self-pay | Admitting: Internal Medicine

## 2019-05-18 ENCOUNTER — Other Ambulatory Visit: Payer: Self-pay | Admitting: Cardiology

## 2019-05-18 DIAGNOSIS — I10 Essential (primary) hypertension: Secondary | ICD-10-CM

## 2019-05-18 DIAGNOSIS — I2510 Atherosclerotic heart disease of native coronary artery without angina pectoris: Secondary | ICD-10-CM

## 2019-07-11 ENCOUNTER — Encounter: Payer: Self-pay | Admitting: Internal Medicine

## 2019-07-11 DIAGNOSIS — J301 Allergic rhinitis due to pollen: Secondary | ICD-10-CM

## 2019-07-11 MED ORDER — AZELASTINE HCL 0.1 % NA SOLN: 1.0000 | Freq: Two times a day (BID) | NASAL | 3 refills | Status: DC

## 2019-07-28 LAB — HM MAMMOGRAPHY

## 2019-08-05 ENCOUNTER — Encounter: Payer: Self-pay | Admitting: Internal Medicine

## 2019-11-13 ENCOUNTER — Other Ambulatory Visit: Payer: Self-pay

## 2019-11-13 DIAGNOSIS — J301 Allergic rhinitis due to pollen: Secondary | ICD-10-CM

## 2019-11-13 MED ORDER — MONTELUKAST SODIUM 10 MG PO TABS: 10.0000 mg | ORAL_TABLET | Freq: Every day | ORAL | 3 refills | Status: DC

## 2019-11-17 ENCOUNTER — Other Ambulatory Visit: Payer: Self-pay | Admitting: Cardiology

## 2019-11-17 DIAGNOSIS — E7800 Pure hypercholesterolemia, unspecified: Secondary | ICD-10-CM

## 2019-11-17 MED ORDER — ATORVASTATIN CALCIUM 80 MG PO TABS: 80.0000 mg | ORAL_TABLET | Freq: Every day | ORAL | 3 refills | Status: DC

## 2019-11-20 ENCOUNTER — Other Ambulatory Visit: Payer: Self-pay

## 2019-11-20 DIAGNOSIS — E7800 Pure hypercholesterolemia, unspecified: Secondary | ICD-10-CM

## 2019-11-20 MED ORDER — ATORVASTATIN CALCIUM 80 MG PO TABS: 80.0000 mg | ORAL_TABLET | Freq: Every day | ORAL | 1 refills | Status: DC

## 2019-11-22 ENCOUNTER — Other Ambulatory Visit: Payer: Self-pay | Admitting: Internal Medicine

## 2019-11-22 DIAGNOSIS — J301 Allergic rhinitis due to pollen: Secondary | ICD-10-CM

## 2019-11-22 MED ORDER — FLUTICASONE PROPIONATE 50 MCG/ACT NA SUSP: 2.0000 | Freq: Every day | NASAL | 3 refills | Status: DC

## 2019-11-28 ENCOUNTER — Ambulatory Visit: Payer: 59 | Admitting: Cardiology

## 2019-11-28 ENCOUNTER — Other Ambulatory Visit: Payer: Self-pay

## 2019-12-01 ENCOUNTER — Encounter: Payer: Self-pay | Admitting: Internal Medicine

## 2019-12-09 ENCOUNTER — Telehealth: Payer: 59 | Admitting: Internal Medicine

## 2019-12-20 ENCOUNTER — Other Ambulatory Visit: Payer: Self-pay | Admitting: Cardiology

## 2019-12-20 DIAGNOSIS — I10 Essential (primary) hypertension: Secondary | ICD-10-CM

## 2019-12-20 DIAGNOSIS — I2510 Atherosclerotic heart disease of native coronary artery without angina pectoris: Secondary | ICD-10-CM

## 2020-01-06 ENCOUNTER — Other Ambulatory Visit: Payer: Self-pay | Admitting: Internal Medicine

## 2020-01-06 ENCOUNTER — Encounter: Payer: Self-pay | Admitting: Internal Medicine

## 2020-01-06 DIAGNOSIS — F411 Generalized anxiety disorder: Secondary | ICD-10-CM

## 2020-01-06 DIAGNOSIS — J301 Allergic rhinitis due to pollen: Secondary | ICD-10-CM

## 2020-01-06 MED ORDER — AZELASTINE HCL 0.1 % NA SOLN: 1.0000 | Freq: Two times a day (BID) | NASAL | 3 refills | Status: DC

## 2020-01-06 MED ORDER — ALPRAZOLAM 0.25 MG PO TABS: ORAL_TABLET | ORAL | 2 refills | Status: DC

## 2020-02-01 ENCOUNTER — Other Ambulatory Visit: Payer: Self-pay | Admitting: Cardiology

## 2020-02-01 DIAGNOSIS — E7800 Pure hypercholesterolemia, unspecified: Secondary | ICD-10-CM

## 2020-03-16 ENCOUNTER — Telehealth: Payer: Self-pay

## 2020-03-16 NOTE — Telephone Encounter (Signed)
Pt reminded of Cologuard kit; states she will need another b/c she discarded it; verified shipping address.  Annual appt also made

## 2020-04-01 ENCOUNTER — Other Ambulatory Visit: Payer: Self-pay | Admitting: Cardiology

## 2020-04-01 DIAGNOSIS — I10 Essential (primary) hypertension: Secondary | ICD-10-CM

## 2020-04-20 ENCOUNTER — Other Ambulatory Visit: Payer: Self-pay

## 2020-04-21 ENCOUNTER — Ambulatory Visit (INDEPENDENT_AMBULATORY_CARE_PROVIDER_SITE_OTHER): Payer: 59 | Admitting: Internal Medicine

## 2020-04-21 ENCOUNTER — Encounter: Payer: Self-pay | Admitting: Internal Medicine

## 2020-04-21 VITALS — BP 136/84 | HR 73 | Temp 98.6°F | Resp 16 | Ht 67.0 in | Wt 139.0 lb

## 2020-04-21 DIAGNOSIS — Z0000 Encounter for general adult medical examination without abnormal findings: Secondary | ICD-10-CM

## 2020-04-21 DIAGNOSIS — R739 Hyperglycemia, unspecified: Secondary | ICD-10-CM

## 2020-04-21 DIAGNOSIS — E538 Deficiency of other specified B group vitamins: Secondary | ICD-10-CM | POA: Diagnosis not present

## 2020-04-21 DIAGNOSIS — I10 Essential (primary) hypertension: Secondary | ICD-10-CM | POA: Diagnosis not present

## 2020-04-21 LAB — URINALYSIS, ROUTINE W REFLEX MICROSCOPIC
Bilirubin Urine: NEGATIVE
Hgb urine dipstick: NEGATIVE
Ketones, ur: NEGATIVE
Leukocytes,Ua: NEGATIVE
Nitrite: NEGATIVE
Specific Gravity, Urine: 1.025 (ref 1.000–1.030)
Total Protein, Urine: NEGATIVE
Urine Glucose: NEGATIVE
Urobilinogen, UA: 0.2 (ref 0.0–1.0)
pH: 6.5 (ref 5.0–8.0)

## 2020-04-21 LAB — CBC WITH DIFFERENTIAL/PLATELET
Basophils Absolute: 0 10*3/uL (ref 0.0–0.1)
Basophils Relative: 0.3 % (ref 0.0–3.0)
Eosinophils Absolute: 0.1 10*3/uL (ref 0.0–0.7)
Eosinophils Relative: 2.2 % (ref 0.0–5.0)
HCT: 37.9 % (ref 36.0–46.0)
Hemoglobin: 12.6 g/dL (ref 12.0–15.0)
Lymphocytes Relative: 33.9 % (ref 12.0–46.0)
Lymphs Abs: 1.9 10*3/uL (ref 0.7–4.0)
MCHC: 33.3 g/dL (ref 30.0–36.0)
MCV: 86.2 fl (ref 78.0–100.0)
Monocytes Absolute: 0.6 10*3/uL (ref 0.1–1.0)
Monocytes Relative: 10.2 % (ref 3.0–12.0)
Neutro Abs: 3 10*3/uL (ref 1.4–7.7)
Neutrophils Relative %: 53.4 % (ref 43.0–77.0)
Platelets: 222 10*3/uL (ref 150.0–400.0)
RBC: 4.39 Mil/uL (ref 3.87–5.11)
RDW: 13.1 % (ref 11.5–15.5)
WBC: 5.5 10*3/uL (ref 4.0–10.5)

## 2020-04-21 LAB — BASIC METABOLIC PANEL
BUN: 13 mg/dL (ref 6–23)
CO2: 31 mEq/L (ref 19–32)
Calcium: 9.6 mg/dL (ref 8.4–10.5)
Chloride: 101 mEq/L (ref 96–112)
Creatinine, Ser: 0.6 mg/dL (ref 0.40–1.20)
GFR: 102.34 mL/min (ref >60.00)
Glucose, Bld: 101 mg/dL — ABNORMAL HIGH (ref 70–99)
Potassium: 4.7 mEq/L (ref 3.5–5.1)
Sodium: 139 mEq/L (ref 135–145)

## 2020-04-21 LAB — FOLATE: Folate: 10.8 ng/mL (ref >5.9)

## 2020-04-21 LAB — HEPATIC FUNCTION PANEL
ALT: 17 U/L (ref 0–35)
AST: 16 U/L (ref 0–37)
Albumin: 4.4 g/dL (ref 3.5–5.2)
Alkaline Phosphatase: 61 U/L (ref 39–117)
Bilirubin, Direct: 0.1 mg/dL (ref 0.0–0.3)
Total Bilirubin: 0.5 mg/dL (ref 0.2–1.2)
Total Protein: 7.6 g/dL (ref 6.0–8.3)

## 2020-04-21 LAB — LIPID PANEL
Cholesterol: 150 mg/dL (ref 0–200)
HDL: 71.5 mg/dL (ref >39.00)
LDL Cholesterol: 65 mg/dL (ref 0–99)
NonHDL: 78.27
Total CHOL/HDL Ratio: 2
Triglycerides: 65 mg/dL (ref 0.0–149.0)
VLDL: 13 mg/dL (ref 0.0–40.0)

## 2020-04-21 LAB — HEMOGLOBIN A1C: Hgb A1c MFr Bld: 6 % (ref 4.6–6.5)

## 2020-04-21 LAB — VITAMIN B12: Vitamin B-12: 1295 pg/mL — ABNORMAL HIGH (ref 211–911)

## 2020-04-21 LAB — TSH: TSH: 2.72 u[IU]/mL (ref 0.35–4.50)

## 2020-04-21 NOTE — Patient Instructions (Signed)
Health Maintenance, Female Adopting a healthy lifestyle and getting preventive care are important in promoting health and wellness. Ask your health care provider about:  The right schedule for you to have regular tests and exams.  Things you can do on your own to prevent diseases and keep yourself healthy. What should I know about diet, weight, and exercise? Eat a healthy diet   Eat a diet that includes plenty of vegetables, fruits, low-fat dairy products, and lean protein.  Do not eat a lot of foods that are high in solid fats, added sugars, or sodium. Maintain a healthy weight Body mass index (BMI) is used to identify weight problems. It estimates body fat based on height and weight. Your health care provider can help determine your BMI and help you achieve or maintain a healthy weight. Get regular exercise Get regular exercise. This is one of the most important things you can do for your health. Most adults should:  Exercise for at least 150 minutes each week. The exercise should increase your heart rate and make you sweat (moderate-intensity exercise).  Do strengthening exercises at least twice a week. This is in addition to the moderate-intensity exercise.  Spend less time sitting. Even light physical activity can be beneficial. Watch cholesterol and blood lipids Have your blood tested for lipids and cholesterol at 53 years of age, then have this test every 5 years. Have your cholesterol levels checked more often if:  Your lipid or cholesterol levels are high.  You are older than 53 years of age.  You are at high risk for heart disease. What should I know about cancer screening? Depending on your health history and family history, you may need to have cancer screening at various ages. This may include screening for:  Breast cancer.  Cervical cancer.  Colorectal cancer.  Skin cancer.  Lung cancer. What should I know about heart disease, diabetes, and high blood  pressure? Blood pressure and heart disease  High blood pressure causes heart disease and increases the risk of stroke. This is more likely to develop in people who have high blood pressure readings, are of African descent, or are overweight.  Have your blood pressure checked: ? Every 3-5 years if you are 18-39 years of age. ? Every year if you are 40 years old or older. Diabetes Have regular diabetes screenings. This checks your fasting blood sugar level. Have the screening done:  Once every three years after age 40 if you are at a normal weight and have a low risk for diabetes.  More often and at a younger age if you are overweight or have a high risk for diabetes. What should I know about preventing infection? Hepatitis B If you have a higher risk for hepatitis B, you should be screened for this virus. Talk with your health care provider to find out if you are at risk for hepatitis B infection. Hepatitis C Testing is recommended for:  Everyone born from 1945 through 1965.  Anyone with known risk factors for hepatitis C. Sexually transmitted infections (STIs)  Get screened for STIs, including gonorrhea and chlamydia, if: ? You are sexually active and are younger than 53 years of age. ? You are older than 53 years of age and your health care provider tells you that you are at risk for this type of infection. ? Your sexual activity has changed since you were last screened, and you are at increased risk for chlamydia or gonorrhea. Ask your health care provider if   you are at risk.  Ask your health care provider about whether you are at high risk for HIV. Your health care provider may recommend a prescription medicine to help prevent HIV infection. If you choose to take medicine to prevent HIV, you should first get tested for HIV. You should then be tested every 3 months for as long as you are taking the medicine. Pregnancy  If you are about to stop having your period (premenopausal) and  you may become pregnant, seek counseling before you get pregnant.  Take 400 to 800 micrograms (mcg) of folic acid every day if you become pregnant.  Ask for birth control (contraception) if you want to prevent pregnancy. Osteoporosis and menopause Osteoporosis is a disease in which the bones lose minerals and strength with aging. This can result in bone fractures. If you are 65 years old or older, or if you are at risk for osteoporosis and fractures, ask your health care provider if you should:  Be screened for bone loss.  Take a calcium or vitamin D supplement to lower your risk of fractures.  Be given hormone replacement therapy (HRT) to treat symptoms of menopause. Follow these instructions at home: Lifestyle  Do not use any products that contain nicotine or tobacco, such as cigarettes, e-cigarettes, and chewing tobacco. If you need help quitting, ask your health care provider.  Do not use street drugs.  Do not share needles.  Ask your health care provider for help if you need support or information about quitting drugs. Alcohol use  Do not drink alcohol if: ? Your health care provider tells you not to drink. ? You are pregnant, may be pregnant, or are planning to become pregnant.  If you drink alcohol: ? Limit how much you use to 0-1 drink a day. ? Limit intake if you are breastfeeding.  Be aware of how much alcohol is in your drink. In the [place] one drink equals one 12 oz bottle of beer (355 mL), one 5 oz glass of wine (148 mL), or one 1 oz glass of hard liquor (44 mL). General instructions  Schedule regular health, dental, and eye exams.  Stay current with your vaccines.  Tell your health care provider if: ? You often feel depressed. ? You have ever been abused or do not feel safe at home. Summary  Adopting a healthy lifestyle and getting preventive care are important in promoting health and wellness.  Follow your health care provider's instructions about healthy  diet, exercising, and getting tested or screened for diseases.  Follow your health care provider's instructions on monitoring your cholesterol and blood pressure. This information is not intended to replace advice given to you by your health care provider. Make sure you discuss any questions you have with your health care provider. Document Revised: 04/17/2018 Document Reviewed: 04/17/2018 Elsevier Patient Education  2020 Elsevier Inc.  

## 2020-04-21 NOTE — Progress Notes (Signed)
Subjective:  Patient ID: Alexandra Swanson, female    DOB: [birth year]  Age: 54 y.o. MRN: [id number]  CC: Annual Exam, Hypertension, and Hyperlipidemia  This visit occurred during the SARS-CoV-2 public health emergency.  Safety protocols were in place, including screening questions prior to the visit, additional usage of staff PPE, and extensive cleaning of exam room while observing appropriate contact time as indicated for disinfecting solutions.    HPI Alexandra Swanson presents for a CPX.  She has a history of B12 deficiency and is currently taking a B12 supplement.  She tells me her blood pressure has been well controlled.  She tells me she can climb steps without experiencing chest pain, shortness of breath, palpitations, edema, or fatigue.  She denies dizziness or lightheadedness.  Outpatient Medications Prior to Visit  Medication Sig Dispense Refill  . ALPRAZolam (XANAX) 0.25 MG tablet TAKE ONE TABLET BY MOUTH TWICE A DAY AS NEEDED FOR ANXIETY 30 tablet 2  . aspirin 81 MG tablet Take 81 mg by mouth daily.    Marland Kitchen atorvastatin (LIPITOR) 80 MG tablet Take 1 tablet (80 mg) by mouth daily. 90 tablet 0  . azelastine (ASTELIN) 0.1 % nasal spray Place 1 spray into both nostrils 2 (two) times daily. Use in each nostril as directed 30 mL 3  . Biotin 1000 MCG tablet Take 1,000 mcg by mouth daily.    . carvedilol (COREG) 6.25 MG tablet Take 1 tablet by mouth twice daily with meals. 180 tablet 0  . fexofenadine (ALLEGRA) 180 MG tablet Take 180 mg by mouth daily.    . fluticasone (FLONASE) 50 MCG/ACT nasal spray Place 2 sprays into both nostrils daily. 48 g 3  . montelukast (SINGULAIR) 10 MG tablet Take 1 tablet (10 mg total) by mouth daily. 90 tablet 3  . olmesartan (BENICAR) 20 MG tablet TAKE 1 TABLET BY MOUTH  DAILY 90 tablet 3   No facility-administered medications prior to visit.    ROS Review of Systems  Constitutional: Negative.  Negative for appetite change, diaphoresis, fatigue and  unexpected weight change.  HENT: Negative.   Eyes: Negative.   Respiratory: Negative for cough, chest tightness, shortness of breath and wheezing.   Cardiovascular: Negative for chest pain, palpitations and leg swelling.  Gastrointestinal: Negative for abdominal pain, constipation, diarrhea, nausea and vomiting.  Endocrine: Negative.   Genitourinary: Negative.  Negative for difficulty urinating, dysuria and hematuria.  Musculoskeletal: Negative for arthralgias and myalgias.  Skin: Negative.   Neurological: Negative for dizziness, weakness, light-headedness and numbness.  Hematological: Negative for adenopathy. Does not bruise/bleed easily.  Psychiatric/Behavioral: Negative for dysphoric mood, sleep disturbance and suicidal ideas. The patient is nervous/anxious.     Objective:  BP 136/84   Pulse 73   Temp 98.6 F (37 C) (Oral)   Resp 16   Ht $R'5\' 7"'lA$  (1.702 m)   Wt 139 lb (63 kg)   SpO2 97%   BMI 21.77 kg/m   BP Readings from Last 3 Encounters:  04/21/20 136/84  04/22/19 136/84  11/29/18 132/82    Wt Readings from Last 3 Encounters:  04/21/20 139 lb (63 kg)  04/22/19 139 lb 4 oz (63.2 kg)  11/29/18 139 lb (63 kg)    Physical Exam Vitals reviewed.  HENT:     Nose: Nose normal.     Mouth/Throat:     Mouth: Mucous membranes are moist.  Eyes:     General: No scleral icterus.    Conjunctiva/sclera: Conjunctivae normal.  Cardiovascular:  Rate and Rhythm: Normal rate and regular rhythm.     Heart sounds: No murmur heard.   Pulmonary:     Effort: Pulmonary effort is normal.     Breath sounds: No stridor. No wheezing, rhonchi or rales.  Abdominal:     General: Abdomen is flat. Bowel sounds are normal. There is no distension.     Palpations: Abdomen is soft. There is no hepatomegaly, splenomegaly or mass.     Tenderness: There is no abdominal tenderness.  Musculoskeletal:        General: Normal range of motion.     Cervical back: Neck supple.     Right lower leg:  No edema.     Left lower leg: No edema.  Lymphadenopathy:     Cervical: No cervical adenopathy.  Skin:    General: Skin is warm and dry.  Neurological:     General: No focal deficit present.     Mental Status: She is alert.  Psychiatric:        Mood and Affect: Mood normal.        Behavior: Behavior normal.     Lab Results  Component Value Date   WBC 5.5 04/21/2020   HGB 12.6 04/21/2020   HCT 37.9 04/21/2020   PLT 222.0 04/21/2020   GLUCOSE 101 (H) 04/21/2020   CHOL 150 04/21/2020   TRIG 65.0 04/21/2020   HDL 71.50 04/21/2020   LDLCALC 65 04/21/2020   ALT 17 04/21/2020   AST 16 04/21/2020   NA 139 04/21/2020   K 4.7 04/21/2020   CL 101 04/21/2020   CREATININE 0.60 04/21/2020   BUN 13 04/21/2020   CO2 31 04/21/2020   TSH 2.72 04/21/2020   INR 1.09 12/31/2015   HGBA1C 6.0 04/21/2020    No results found.  Assessment & Plan:   Romey was seen today for annual exam, hypertension and hyperlipidemia.  Diagnoses and all orders for this visit:  Essential hypertension- Her blood pressure is adequately well controlled.  Electrolytes and renal function are normal. -     Basic metabolic panel; Future -     Urinalysis, Routine w reflex microscopic; Future -     TSH; Future -     Hepatic function panel; Future -     Hepatic function panel -     Urinalysis, Routine w reflex microscopic -     Basic metabolic panel -     TSH  Hyperglycemia- Her A1c is 6.0%.  She is prediabetic.  Medical therapy is not indicated. -     Hemoglobin A1c; Future -     Hemoglobin A1c  Routine general medical examination at a health care facility- Exam completed, labs reviewed, vaccines reviewed and updated, cancer screenings were addressed - She has a Cologuard kit at home and agrees to submit it soon, patient education material was given. -     Lipid panel; Future -     Lipid panel  B12 nutritional deficiency- Her H&H, B12, and folate levels are normal. -     CBC with Differential/Platelet;  Future -     Vitamin B12; Future -     Folate; Future -     Folate -     Vitamin B12 -     CBC with Differential/Platelet   I am having Alexandra Swanson "Alexandra Swanson" maintain her aspirin, fexofenadine, Biotin, montelukast, fluticasone, carvedilol, azelastine, ALPRAZolam, atorvastatin, and olmesartan.  No orders of the defined types were placed in this encounter.    Follow-up:  Return in about 6 months (around 10/20/2020).  Scarlette Calico, MD

## 2020-05-04 ENCOUNTER — Other Ambulatory Visit: Payer: Self-pay | Admitting: Internal Medicine

## 2020-05-04 ENCOUNTER — Telehealth: Payer: Self-pay | Admitting: Internal Medicine

## 2020-05-04 DIAGNOSIS — F411 Generalized anxiety disorder: Secondary | ICD-10-CM

## 2020-05-04 MED ORDER — ALPRAZOLAM 0.25 MG PO TABS: ORAL_TABLET | ORAL | 5 refills | Status: DC

## 2020-05-04 NOTE — Telephone Encounter (Signed)
[facility] pharmacy calling requesting a new order for the ALPRAZolam Prudy Feeler) 0.25 MG tablet  [facility] [address] [place] - [address] Phone:  [phone]  Fax:  [phone]

## 2020-08-10 ENCOUNTER — Other Ambulatory Visit: Payer: Self-pay

## 2020-08-10 DIAGNOSIS — I10 Essential (primary) hypertension: Secondary | ICD-10-CM

## 2020-08-10 DIAGNOSIS — I2510 Atherosclerotic heart disease of native coronary artery without angina pectoris: Secondary | ICD-10-CM

## 2020-08-10 MED ORDER — CARVEDILOL 6.25 MG PO TABS: 6.2500 mg | ORAL_TABLET | Freq: Two times a day (BID) | ORAL | 0 refills | Status: DC

## 2020-08-16 DIAGNOSIS — Z01419 Encounter for gynecological examination (general) (routine) without abnormal findings: Secondary | ICD-10-CM | POA: Diagnosis not present

## 2020-08-16 DIAGNOSIS — Z1231 Encounter for screening mammogram for malignant neoplasm of breast: Secondary | ICD-10-CM | POA: Diagnosis not present

## 2020-08-16 DIAGNOSIS — Z6821 Body mass index (BMI) 21.0-21.9, adult: Secondary | ICD-10-CM | POA: Diagnosis not present

## 2020-08-26 LAB — HM PAP SMEAR

## 2020-08-26 LAB — HM MAMMOGRAPHY

## 2020-09-09 ENCOUNTER — Other Ambulatory Visit: Payer: Self-pay | Admitting: Internal Medicine

## 2020-09-09 DIAGNOSIS — J301 Allergic rhinitis due to pollen: Secondary | ICD-10-CM

## 2020-10-01 ENCOUNTER — Other Ambulatory Visit: Payer: Self-pay

## 2020-10-01 ENCOUNTER — Ambulatory Visit: Payer: BC Managed Care – PPO | Admitting: Cardiology

## 2020-10-01 ENCOUNTER — Encounter: Payer: Self-pay | Admitting: Cardiology

## 2020-10-01 VITALS — BP 121/74 | HR 69 | Temp 98.4°F | Resp 17 | Ht 67.0 in | Wt 137.4 lb

## 2020-10-01 DIAGNOSIS — I2510 Atherosclerotic heart disease of native coronary artery without angina pectoris: Secondary | ICD-10-CM

## 2020-10-01 DIAGNOSIS — I10 Essential (primary) hypertension: Secondary | ICD-10-CM

## 2020-10-01 DIAGNOSIS — E7800 Pure hypercholesterolemia, unspecified: Secondary | ICD-10-CM | POA: Diagnosis not present

## 2020-10-01 DIAGNOSIS — R739 Hyperglycemia, unspecified: Secondary | ICD-10-CM

## 2020-10-01 NOTE — Progress Notes (Signed)
Primary Physician/Referring:  Etta Grandchild, MD  Patient ID: Alexandra Swanson, female    DOB: [birth year], 55 y.o.   MRN: [id number]  Chief Complaint  Patient presents with  . Coronary Artery Disease  . Follow-up   HPI:    HPI: Alexandra Swanson  is a 55 y.o. with acute anterior ST elevation on 12/31/2015 S/P primary angioplasty to mid LAD and very mild residual circumflex disease presents here for annual visit.  She is essentially asymptomatic.   Past Medical History:  Diagnosis Date  . Anxiety   . Heart attack (HCC) 12/31/2015  . Hyperlipidemia   . Hypertension    Past Surgical History:  Procedure Laterality Date  . CARDIAC CATHETERIZATION N/A 12/31/2015   Procedure: Left Heart Cath and Coronary Angiography;  Surgeon: Yates Decamp, MD;  Location: [facility] INVASIVE CV LAB;  Service: Cardiovascular;  Laterality: N/A;  . CARDIAC CATHETERIZATION N/A 12/31/2015   Procedure: Coronary Stent Intervention;  Surgeon: Yates Decamp, MD;  Location: [facility] INVASIVE CV LAB;  Service: Cardiovascular;  Laterality: N/A;  . CESAREAN SECTION  1999  . CHOLECYSTECTOMY  2010   Social History   Tobacco Use  . Smoking status: Never Smoker  . Smokeless tobacco: Never Used  Substance Use Topics  . Alcohol use: Yes    Alcohol/week: 12.0 standard drinks    Types: 7 Glasses of wine, 5 Cans of beer per week    Comment: social  Marital Status: Divorced    ROS  Review of Systems  Cardiovascular: Negative for chest pain, dyspnea on exertion and leg swelling.  Gastrointestinal: Negative for melena.   Objective  Blood pressure 121/74, pulse 69, temperature 98.4 F (36.9 C), temperature source Temporal, resp. rate 17, height 5\' 7"  (1.702 m), weight 137 lb 6.4 oz (62.3 kg), SpO2 98 %. Body mass index is 21.52 kg/m.   Vitals with BMI 10/01/2020 10/01/2020 04/21/2020  Height - 5\' 7"  5\' 7"   Weight - 137 lbs 6 oz 139 lbs  BMI - 21.51 21.77  Systolic 121 145 04/23/2020  Diastolic 74 90 84  Pulse 69 74 73    Physical  Exam Constitutional:      General: She is not in acute distress.    Appearance: She is well-developed.  HENT:     Head: Atraumatic.  Eyes:     Conjunctiva/sclera: Conjunctivae normal.  Neck:     Thyroid: No thyromegaly.     Vascular: No JVD.  Cardiovascular:     Rate and Rhythm: Normal rate and regular rhythm.     Pulses: Intact distal pulses.     Heart sounds: Normal heart sounds. No murmur heard. No gallop.   Pulmonary:     Effort: Pulmonary effort is normal.     Breath sounds: Normal breath sounds.  Abdominal:     General: Bowel sounds are normal.     Palpations: Abdomen is soft.  Musculoskeletal:        General: Normal range of motion.     Cervical back: Neck supple.  Skin:    General: Skin is warm and dry.  Neurological:     Mental Status: She is alert.    Radiology: No results found.  Laboratory examination:   CMP Latest Ref Rng & Units 04/21/2020 04/22/2019 03/25/2018  Glucose 70 - 99 mg/dL 04/23/2020) 96 04/24/2019)  BUN 6 - 23 mg/dL 13 17 12   Creatinine 0.40 - 1.20 mg/dL 03/27/2018 809(X 833(A  Sodium 135 - 145 mEq/L 139 138 138  Potassium 3.5 - 5.1  mEq/L 4.7 4.5 4.2  Chloride 96 - 112 mEq/L 101 103 100  CO2 19 - 32 mEq/L 31 26 30   Calcium 8.4 - 10.5 mg/dL 9.6 9.4 9.6  Total Protein 6.0 - 8.3 g/dL 7.6 7.3 7.5  Total Bilirubin 0.2 - 1.2 mg/dL 0.5 0.6 0.5  Alkaline Phos 39 - 117 U/L 61 53 53  AST 0 - 37 U/L 16 17 24   ALT 0 - 35 U/L 17 17 28    CBC Latest Ref Rng & Units 04/21/2020 04/22/2019 03/25/2018  WBC 4.0 - 10.5 K/uL 5.5 7.6 7.3  Hemoglobin 12.0 - 15.0 g/dL 04/23/2020 04/24/2019 03/27/2018  Hematocrit 36.0 - 46.0 % 37.9 41.7 38.6  Platelets 150.0 - 400.0 K/uL 222.0 212.0 234.0   Lipid Panel     Component Value Date/Time   CHOL 150 04/21/2020 0857   TRIG 65.0 04/21/2020 0857   HDL 71.50 04/21/2020 0857   CHOLHDL 2 04/21/2020 0857   VLDL 13.0 04/21/2020 0857   LDLCALC 65 04/21/2020 0857   HEMOGLOBIN A1C Lab Results  Component Value Date   HGBA1C 6.0 04/21/2020   MPG 108  12/31/2015   TSH Recent Labs    04/21/20 0857  TSH 2.72   Medications   Allergies  Allergen Reactions  . Clarithromycin Nausea Only    Current Outpatient Medications  Medication Instructions  . ALPRAZolam (XANAX) 0.25 MG tablet TAKE ONE TABLET BY MOUTH TWICE A DAY AS NEEDED FOR ANXIETY  . aspirin 81 mg, Oral, Daily,    . atorvastatin (LIPITOR) 80 MG tablet Take 1 tablet (80 mg) by mouth daily.  04/23/2020 azelastine (ASTELIN) 0.1 % nasal spray 1 spray, Each Nare, 2 times daily, Use in each nostril as directed  . Biotin 1,000 mcg, Oral, Daily  . carvedilol (COREG) 6.25 mg, Oral, 2 times daily with meals  . fexofenadine (ALLEGRA) 180 mg, Oral, Daily  . montelukast (SINGULAIR) 10 MG tablet Take 1 tablet by mouth daily.  01/02/2016 olmesartan (BENICAR) 20 MG tablet TAKE 1 TABLET BY MOUTH  DAILY  . valACYclovir (VALTREX) 1000 MG tablet As needed   Cardiac Studies:   Coronary Angiogram [12/31/2015]: CAD S/P Stent from prox to mid 3.5x12, 3.5x18 and 3.5x12 mm Resolute DES. Mild disease Cx. Spasm in large RV branch improved with IC NTG. LVEF 35%  Treadmill stress test [02/18/2016]:  The resting electrocardiogram demonstrated normal sinus rhythm, no resting arrhythmias and normal rest repolarization. The stress electrocardiogram was normal. There were no significant arrhythmias. Patient exercised on Bruce protocol for 10:01 minutes and achieved 95% of Max Predicted HR (Target HR was >85% MPHR) and 11.84 METS. Stress symptoms included fatigue and dyspnea. Normal BP response. Exercise capacity was excellent . Impression: Normal stress EKG. No significant arrhythmias. Normal BP response.  Echocardiogram [04/12/2016]: 1. Left ventricle cavity is normal in size. Visual EF is 55- 60%. Minimal apical septal hypokinesia. Normal diastolic filling pattern, normal LAP. Calculated EF 65%. 2. Left atrial cavity is normal in size. 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 3. Mild  (Grade I) aortic regurgitation. 4. Mild (Grade I) mitral regurgitation. 5. Trace tricuspid regurgitation.  EKG:  EKG 10/01/2020: Normal sinus rhythm at the rate of 72 bpm, normal axis, no evidence of ischemia.  Normal EKG. no significant change from 11/29/2018.  Assessment     ICD-10-CM   1. Coronary artery disease involving native coronary artery of native heart without angina pectoris  I25.10 EKG 12-Lead  2. Hypercholesteremia  E78.00  3. Essential hypertension  I10   4. Hyperglycemia  R73.9      Recommendations:   Audine Mangione  is a 55 y.o. Caucasian female patient with acute anterior ST elevation on 12/31/2015 S/P primary angioplasty to mid LAD and very mild residual circumflex disease presents here for annual visit.  She is essentially asymptomatic.  Lipids are under excellent control blood pressure is also well controlled.  She does have mild hyperglycemia.  Could consider addition of metformin or even Jardiance.  Overall she is stable, no changes were done today, external labs reviewed, I will see her back in 1 year or sooner if problems.   Yates Decamp, MD, [facility] 10/01/2020, 3:36 PM Office: [phone] Fax: [phone] Pager: [phone]

## 2020-10-07 LAB — COLOGUARD

## 2020-10-29 ENCOUNTER — Other Ambulatory Visit: Payer: Self-pay

## 2020-10-29 DIAGNOSIS — I10 Essential (primary) hypertension: Secondary | ICD-10-CM

## 2020-10-29 DIAGNOSIS — I2510 Atherosclerotic heart disease of native coronary artery without angina pectoris: Secondary | ICD-10-CM

## 2020-10-29 MED ORDER — CARVEDILOL 6.25 MG PO TABS: 6.2500 mg | ORAL_TABLET | Freq: Two times a day (BID) | ORAL | 3 refills | Status: DC

## 2020-12-01 ENCOUNTER — Other Ambulatory Visit: Payer: Self-pay

## 2020-12-01 DIAGNOSIS — E7800 Pure hypercholesterolemia, unspecified: Secondary | ICD-10-CM

## 2020-12-01 MED ORDER — ATORVASTATIN CALCIUM 80 MG PO TABS: ORAL_TABLET | ORAL | 3 refills | Status: DC

## 2020-12-15 ENCOUNTER — Ambulatory Visit: Payer: Self-pay | Admitting: Internal Medicine

## 2020-12-15 ENCOUNTER — Other Ambulatory Visit: Payer: Self-pay | Admitting: Internal Medicine

## 2020-12-15 DIAGNOSIS — J301 Allergic rhinitis due to pollen: Secondary | ICD-10-CM

## 2021-01-06 ENCOUNTER — Encounter: Payer: Self-pay | Admitting: Internal Medicine

## 2021-01-06 ENCOUNTER — Ambulatory Visit (INDEPENDENT_AMBULATORY_CARE_PROVIDER_SITE_OTHER): Payer: BC Managed Care – PPO | Admitting: Internal Medicine

## 2021-01-06 ENCOUNTER — Other Ambulatory Visit: Payer: Self-pay

## 2021-01-06 VITALS — BP 138/82 | HR 72 | Temp 97.7°F | Ht 67.0 in | Wt 142.0 lb

## 2021-01-06 DIAGNOSIS — M159 Polyosteoarthritis, unspecified: Secondary | ICD-10-CM | POA: Insufficient documentation

## 2021-01-06 DIAGNOSIS — I10 Essential (primary) hypertension: Secondary | ICD-10-CM

## 2021-01-06 DIAGNOSIS — M8949 Other hypertrophic osteoarthropathy, multiple sites: Secondary | ICD-10-CM

## 2021-01-06 MED ORDER — DICLOFENAC SODIUM 2 % EX SOLN: 1.0000 | Freq: Three times a day (TID) | CUTANEOUS | 5 refills | Status: DC | PRN

## 2021-01-06 NOTE — Progress Notes (Signed)
Subjective:  Patient ID: Alexandra Swanson, female    DOB: [birth year]  Age: 55 y.o. MRN: [id number]  CC: Hypertension and Osteoarthritis  This visit occurred during the SARS-CoV-2 public health emergency.  Safety protocols were in place, including screening questions prior to the visit, additional usage of staff PPE, and extensive cleaning of exam room while observing appropriate contact time as indicated for disinfecting solutions.    HPI Hlee Fringer presents for f/up -   She complains of arthralgias in her fingers and large joints.  She never notices any swelling in her joints.  She has not taken anything to control the symptoms.  She tells me her blood pressure has been well controlled and she has had no recent episodes of chest pain or shortness of breath.  Outpatient Medications Prior to Visit  Medication Sig Dispense Refill   ALPRAZolam (XANAX) 0.25 MG tablet TAKE ONE TABLET BY MOUTH TWICE A DAY AS NEEDED FOR ANXIETY 30 tablet 5   aspirin 81 MG tablet Take 81 mg by mouth daily.     atorvastatin (LIPITOR) 80 MG tablet Take 1 tablet (80 mg) by mouth daily. 90 tablet 3   azelastine (ASTELIN) 0.1 % nasal spray Instill 1 spray into each nostril twice daily as directed. 30 mL 1   Biotin 1000 MCG tablet Take 1,000 mcg by mouth daily.     carvedilol (COREG) 6.25 MG tablet Take 1 tablet (6.25 mg total) by mouth 2 (two) times daily with a meal. 180 tablet 3   fexofenadine (ALLEGRA) 180 MG tablet Take 180 mg by mouth daily.     montelukast (SINGULAIR) 10 MG tablet Take 1 tablet by mouth daily. 90 tablet 1   olmesartan (BENICAR) 20 MG tablet TAKE 1 TABLET BY MOUTH  DAILY 90 tablet 3   valACYclovir (VALTREX) 1000 MG tablet as needed.     No facility-administered medications prior to visit.    ROS Review of Systems  Constitutional:  Negative for diaphoresis, fatigue and unexpected weight change.  HENT: Negative.    Eyes: Negative.   Respiratory:  Negative for chest tightness and shortness  of breath.   Cardiovascular:  Negative for chest pain, palpitations and leg swelling.  Gastrointestinal:  Negative for abdominal pain and nausea.  Endocrine: Negative.   Genitourinary: Negative.  Negative for difficulty urinating.  Musculoskeletal:  Positive for arthralgias. Negative for joint swelling and myalgias.  Skin: Negative.   Neurological: Negative.  Negative for dizziness and weakness.  Hematological:  Negative for adenopathy. Does not bruise/bleed easily.  Psychiatric/Behavioral: Negative.     Objective:  BP 138/82 (BP Location: Left Arm, Patient Position: Sitting, Cuff Size: Large)   Pulse 72   Temp 97.7 F (36.5 C) (Oral)   Ht 5\' 7"  (1.702 m)   Wt 142 lb (64.4 kg)   SpO2 97%   BMI 22.24 kg/m   BP Readings from Last 3 Encounters:  01/06/21 138/82  10/01/20 121/74  04/21/20 136/84    Wt Readings from Last 3 Encounters:  01/06/21 142 lb (64.4 kg)  10/01/20 137 lb 6.4 oz (62.3 kg)  04/21/20 139 lb (63 kg)    Physical Exam Vitals reviewed.  Constitutional:      Appearance: Normal appearance.  HENT:     Nose: Nose normal.     Mouth/Throat:     Mouth: Mucous membranes are moist.  Eyes:     Conjunctiva/sclera: Conjunctivae normal.  Cardiovascular:     Rate and Rhythm: Normal rate and regular rhythm.  Heart sounds: No murmur heard. Pulmonary:     Effort: Pulmonary effort is normal.     Breath sounds: No stridor. No wheezing, rhonchi or rales.  Abdominal:     General: Abdomen is flat.     Palpations: There is no mass.     Tenderness: There is no abdominal tenderness. There is no guarding.     Hernia: No hernia is present.  Musculoskeletal:        General: Deformity present. No swelling or tenderness. Normal range of motion.     Cervical back: Neck supple.     Comments: She has bilateral, symmetrical heberden's nodes  Lymphadenopathy:     Cervical: No cervical adenopathy.  Skin:    General: Skin is warm and dry.  Neurological:     General: No focal  deficit present.     Mental Status: She is alert.  Psychiatric:        Mood and Affect: Mood normal.    Lab Results  Component Value Date   WBC 5.5 04/21/2020   HGB 12.6 04/21/2020   HCT 37.9 04/21/2020   PLT 222.0 04/21/2020   GLUCOSE 101 (H) 04/21/2020   CHOL 150 04/21/2020   TRIG 65.0 04/21/2020   HDL 71.50 04/21/2020   LDLCALC 65 04/21/2020   ALT 17 04/21/2020   AST 16 04/21/2020   NA 139 04/21/2020   K 4.7 04/21/2020   CL 101 04/21/2020   CREATININE 0.60 04/21/2020   BUN 13 04/21/2020   CO2 31 04/21/2020   TSH 2.72 04/21/2020   INR 1.09 12/31/2015   HGBA1C 6.0 04/21/2020    No results found.  Assessment & Plan:   Tykeisha was seen today for hypertension and osteoarthritis.  Diagnoses and all orders for this visit:  Primary osteoarthritis involving multiple joints- She prefers not to take a NSAID due to her history of CAD.  Will try a topical NSAID. -     Diclofenac Sodium (PENNSAID) 2 % SOLN; Apply 1 Act topically every 8 (eight) hours as needed.  Essential hypertension- Her blood pressure is adequately well controlled.  Will continue the current dose of the ARB.  I am having Alexandra Swanson "Alexandra Swanson" start on Diclofenac Sodium. I am also having her maintain her aspirin, fexofenadine, Biotin, olmesartan, ALPRAZolam, montelukast, valACYclovir, carvedilol, atorvastatin, and azelastine.  Meds ordered this encounter  Medications   Diclofenac Sodium (PENNSAID) 2 % SOLN    Sig: Apply 1 Act topically every 8 (eight) hours as needed.    Dispense:  112 g    Refill:  5     Follow-up: No follow-ups on file.  Sanda Linger, MD

## 2021-01-07 ENCOUNTER — Encounter: Payer: Self-pay | Admitting: Internal Medicine

## 2021-01-07 NOTE — Patient Instructions (Signed)
Osteoarthritis Osteoarthritis is a type of arthritis. It refers to joint pain or joint disease. Osteoarthritis affects tissue that covers the ends of bones in joints (cartilage). Cartilage acts as a cushion between the bones and helps them move smoothly. Osteoarthritis occurs when cartilage in the joints gets worn down. Osteoarthritis is sometimes called "wear and tear" arthritis. Osteoarthritis is the most common form of arthritis. It often occurs in older people. It is a condition that gets worse over time. The joints most often affected by this condition are in the fingers, toes, hips, knees, and spine, including the neck and lower back. What are the causes? This condition is caused by the wearing down of cartilage that covers the ends of bones. What increases the risk? The following factors may make you more likely to develop this condition: Being age 50 or older. Obesity. Overuse of joints. Past injury of a joint. Past surgery on a joint. Family history of osteoarthritis. What are the signs or symptoms? The main symptoms of this condition are pain, swelling, and stiffness in the joint. Other symptoms may include: An enlarged joint. More pain and further damage caused by small pieces of bone or cartilage that break off and float inside of the joint. Small deposits of bone (osteophytes) that grow on the edges of the joint. A grating or scraping feeling inside the joint when you move it. Popping or creaking sounds when you move. Difficulty walking or exercising. An inability to grip items, twist your hand(s), or control the movements of your hands and fingers. How is this diagnosed? This condition may be diagnosed based on: Your medical history. A physical exam. Your symptoms. X-rays of the affected joint(s). Blood tests to rule out other types of arthritis. How is this treated? There is no cure for this condition, but treatment can help control pain and improve joint function.  Treatment may include a combination of therapies, such as: Pain relief techniques, such as: Applying heat and cold to the joint. Massage. A form of talk therapy called cognitive behavioral therapy (CBT). This therapy helps you set goals and follow up on the changes that you make. Medicines for pain and inflammation. The medicines can be taken by mouth or applied to the skin. They include: NSAIDs, such as ibuprofen. Prescription medicines. Strong anti-inflammatory medicines (corticosteroids). Certain nutritional supplements. A prescribed exercise program. You may work with a physical therapist. Assistive devices, such as a brace, wrap, splint, specialized glove, or cane. A weight control plan. Surgery, such as: An osteotomy. This is done to reposition the bones and relieve pain or to remove loose pieces of bone and cartilage. Joint replacement surgery. You may need this surgery if you have advanced osteoarthritis. Follow these instructions at home: Activity Rest your affected joints as told by your health care provider. Exercise as told by your health care provider. He or she may recommend specific types of exercise, such as: Strengthening exercises. These are done to strengthen the muscles that support joints affected by arthritis. Aerobic activities. These are exercises, such as brisk walking or water aerobics, that increase your heart rate. Range-of-motion activities. These help your joints move more easily. Balance and agility exercises. Managing pain, stiffness, and swelling   If directed, apply heat to the affected area as often as told by your health care provider. Use the heat source that your health care provider recommends, such as a moist heat pack or a heating pad. If you have a removable assistive device, remove it as told by   your health care provider. Place a towel between your skin and the heat source. If your health care provider tells you to keep the assistive device on  while you apply heat, place a towel between the assistive device and the heat source. Leave the heat on for 20-30 minutes. Remove the heat if your skin turns bright red. This is especially important if you are unable to feel pain, heat, or cold. You may have a greater risk of getting burned. If directed, put ice on the affected area. To do this: If you have a removable assistive device, remove it as told by your health care provider. Put ice in a plastic bag. Place a towel between your skin and the bag. If your health care provider tells you to keep the assistive device on during icing, place a towel between the assistive device and the bag. Leave the ice on for 20 minutes, 2-3 times a day. Move your fingers or toes often to reduce stiffness and swelling. Raise (elevate) the injured area above the level of your heart while you are sitting or lying down. General instructions Take over-the-counter and prescription medicines only as told by your health care provider. Maintain a healthy weight. Follow instructions from your health care provider for weight control. Do not use any products that contain nicotine or tobacco, such as cigarettes, e-cigarettes, and chewing tobacco. If you need help quitting, ask your health care provider. Use assistive devices as told by your health care provider. Keep all follow-up visits as told by your health care provider. This is important. Where to find more information National Institute of Arthritis and Musculoskeletal and Skin Diseases: www.niams.nih.gov National Institute on Aging: www.nia.nih.gov American College of Rheumatology: www.rheumatology.org Contact a health care provider if: You have redness, swelling, or a feeling of warmth in a joint that gets worse. You have a fever along with joint or muscle aches. You develop a rash. You have trouble doing your normal activities. Get help right away if: You have pain that gets worse and is not relieved by  pain medicine. Summary Osteoarthritis is a type of arthritis that affects tissue covering the ends of bones in joints (cartilage). This condition is caused by the wearing down of cartilage that covers the ends of bones. The main symptom of this condition is pain, swelling, and stiffness in the joint. There is no cure for this condition, but treatment can help control pain and improve joint function. This information is not intended to replace advice given to you by your health care provider. Make sure you discuss any questions you have with your health care provider. Document Revised: 04/21/2019 Document Reviewed: 04/21/2019 Elsevier Patient Education  2022 [employer]vier Inc.  

## 2021-01-28 ENCOUNTER — Other Ambulatory Visit: Payer: Self-pay | Admitting: Internal Medicine

## 2021-01-28 DIAGNOSIS — J301 Allergic rhinitis due to pollen: Secondary | ICD-10-CM

## 2021-02-20 ENCOUNTER — Other Ambulatory Visit: Payer: Self-pay | Admitting: Internal Medicine

## 2021-02-20 DIAGNOSIS — J0100 Acute maxillary sinusitis, unspecified: Secondary | ICD-10-CM | POA: Insufficient documentation

## 2021-02-20 MED ORDER — AMOXICILLIN-POT CLAVULANATE 875-125 MG PO TABS: 1.0000 | ORAL_TABLET | Freq: Two times a day (BID) | ORAL | 0 refills | Status: AC

## 2021-02-20 MED ORDER — FLUCONAZOLE 150 MG PO TABS: 150.0000 mg | ORAL_TABLET | Freq: Once | ORAL | 3 refills | Status: AC

## 2021-04-26 ENCOUNTER — Other Ambulatory Visit: Payer: Self-pay

## 2021-04-26 DIAGNOSIS — I10 Essential (primary) hypertension: Secondary | ICD-10-CM

## 2021-04-26 DIAGNOSIS — I2510 Atherosclerotic heart disease of native coronary artery without angina pectoris: Secondary | ICD-10-CM

## 2021-04-26 DIAGNOSIS — E7800 Pure hypercholesterolemia, unspecified: Secondary | ICD-10-CM

## 2021-04-26 MED ORDER — CARVEDILOL 6.25 MG PO TABS: 6.2500 mg | ORAL_TABLET | Freq: Two times a day (BID) | ORAL | 3 refills | Status: DC

## 2021-04-26 MED ORDER — OLMESARTAN MEDOXOMIL 20 MG PO TABS
20.0000 mg | ORAL_TABLET | Freq: Every day | ORAL | 3 refills | Status: DC
Start: 2021-04-26 — End: 2021-07-11

## 2021-04-26 MED ORDER — ATORVASTATIN CALCIUM 80 MG PO TABS: ORAL_TABLET | ORAL | 3 refills | Status: DC

## 2021-04-27 ENCOUNTER — Other Ambulatory Visit: Payer: Self-pay

## 2021-04-27 ENCOUNTER — Ambulatory Visit (INDEPENDENT_AMBULATORY_CARE_PROVIDER_SITE_OTHER): Payer: BC Managed Care – PPO | Admitting: Internal Medicine

## 2021-04-27 ENCOUNTER — Encounter: Payer: Self-pay | Admitting: Internal Medicine

## 2021-04-27 VITALS — BP 124/80 | HR 66 | Temp 97.9°F | Resp 16 | Ht 67.0 in | Wt 144.0 lb

## 2021-04-27 DIAGNOSIS — F411 Generalized anxiety disorder: Secondary | ICD-10-CM | POA: Diagnosis not present

## 2021-04-27 DIAGNOSIS — I10 Essential (primary) hypertension: Secondary | ICD-10-CM

## 2021-04-27 DIAGNOSIS — T700XXA Otitic barotrauma, initial encounter: Secondary | ICD-10-CM | POA: Diagnosis not present

## 2021-04-27 DIAGNOSIS — J301 Allergic rhinitis due to pollen: Secondary | ICD-10-CM

## 2021-04-27 MED ORDER — ALPRAZOLAM 0.25 MG PO TABS: ORAL_TABLET | ORAL | 5 refills | Status: DC

## 2021-04-27 MED ORDER — MONTELUKAST SODIUM 10 MG PO TABS: 10.0000 mg | ORAL_TABLET | Freq: Every day | ORAL | 1 refills | Status: DC

## 2021-04-27 NOTE — Progress Notes (Signed)
Subjective:  Patient ID: Alexandra Swanson, female    DOB: 03/06/1966  Age: 55 y.o. MRN: [id number]  CC: Ear Pain  This visit occurred during the SARS-CoV-2 public health emergency.  Safety protocols were in place, including screening questions prior to the visit, additional usage of staff PPE, and extensive cleaning of exam room while observing appropriate contact time as indicated for disinfecting solutions.    HPI Alexandra Swanson presents for f/up -  She aggressively used a Q-tip in her right ear this morning and had a brief sensation of pain, pressure, and fullness in her right ear.  The symptoms have gradually improved.  Her hearing has not been impaired and she denies dizziness or lightheadedness.  Outpatient Medications Prior to Visit  Medication Sig Dispense Refill   aspirin 81 MG tablet Take 81 mg by mouth daily.     atorvastatin (LIPITOR) 80 MG tablet Take 1 tablet (80 mg) by mouth daily. 90 tablet 3   azelastine (ASTELIN) 0.1 % nasal spray Instill 1 spray into each nostril twice daily as directed. 30 mL 1   Biotin 1000 MCG tablet Take 1,000 mcg by mouth daily.     carvedilol (COREG) 6.25 MG tablet Take 1 tablet (6.25 mg total) by mouth 2 (two) times daily with a meal. 180 tablet 3   Diclofenac Sodium (PENNSAID) 2 % SOLN Apply 1 Act topically every 8 (eight) hours as needed. 112 g 5   fexofenadine (ALLEGRA) 180 MG tablet Take 180 mg by mouth daily.     olmesartan (BENICAR) 20 MG tablet Take 1 tablet (20 mg total) by mouth daily. 90 tablet 3   valACYclovir (VALTREX) 1000 MG tablet as needed.     ALPRAZolam (XANAX) 0.25 MG tablet TAKE ONE TABLET BY MOUTH TWICE A DAY AS NEEDED FOR ANXIETY 30 tablet 5   montelukast (SINGULAIR) 10 MG tablet Take 1 tablet by mouth daily. 90 tablet 1   No facility-administered medications prior to visit.    ROS Review of Systems  Constitutional: Negative.   HENT:  Positive for ear pain. Negative for ear discharge, hearing loss, rhinorrhea and sinus  pressure.   Cardiovascular:  Negative for chest pain, palpitations and leg swelling.  Gastrointestinal:  Negative for abdominal pain, constipation, diarrhea, nausea and vomiting.  Genitourinary: Negative.   Musculoskeletal: Negative.   Skin: Negative.  Negative for rash.  Neurological: Negative.  Negative for dizziness, weakness and headaches.  Hematological:  Negative for adenopathy. Does not bruise/bleed easily.  Psychiatric/Behavioral:  Negative for dysphoric mood and sleep disturbance. The patient is nervous/anxious.    Objective:  BP 124/80 (BP Location: Left Arm, Patient Position: Sitting, Cuff Size: Large)    Pulse 66    Temp 97.9 F (36.6 C) (Oral)    Resp 16    Ht 5\' 7"  (1.702 m)    Wt 144 lb (65.3 kg)    SpO2 96%    BMI 22.55 kg/m   BP Readings from Last 3 Encounters:  04/27/21 124/80  01/06/21 138/82  10/01/20 121/74    Wt Readings from Last 3 Encounters:  04/27/21 144 lb (65.3 kg)  01/06/21 142 lb (64.4 kg)  10/01/20 137 lb 6.4 oz (62.3 kg)    Physical Exam Vitals reviewed.  HENT:     Right Ear: Hearing, tympanic membrane, ear canal and external ear normal. No middle ear effusion. There is no impacted cerumen. No foreign body. No hemotympanum. Tympanic membrane is not injected.     Left Ear: Hearing, tympanic membrane,  ear canal and external ear normal.  No middle ear effusion. There is no impacted cerumen. No foreign body. No hemotympanum. Tympanic membrane is not injected.     Mouth/Throat:     Mouth: Mucous membranes are moist.  Eyes:     General: No scleral icterus.    Conjunctiva/sclera: Conjunctivae normal.  Cardiovascular:     Rate and Rhythm: Normal rate and regular rhythm.     Heart sounds: No murmur heard. Pulmonary:     Effort: Pulmonary effort is normal.     Breath sounds: No stridor. No wheezing, rhonchi or rales.  Abdominal:     General: Abdomen is flat.     Palpations: There is no mass.     Tenderness: There is no abdominal tenderness. There is  no guarding.     Hernia: No hernia is present.  Musculoskeletal:        General: Normal range of motion.     Cervical back: Neck supple.     Right lower leg: No edema.     Left lower leg: No edema.  Lymphadenopathy:     Cervical: No cervical adenopathy.  Skin:    General: Skin is warm and dry.  Neurological:     General: No focal deficit present.  Psychiatric:        Mood and Affect: Mood normal.        Behavior: Behavior normal.    Lab Results  Component Value Date   WBC 5.5 04/21/2020   HGB 12.6 04/21/2020   HCT 37.9 04/21/2020   PLT 222.0 04/21/2020   GLUCOSE 101 (H) 04/21/2020   CHOL 150 04/21/2020   TRIG 65.0 04/21/2020   HDL 71.50 04/21/2020   LDLCALC 65 04/21/2020   ALT 17 04/21/2020   AST 16 04/21/2020   NA 139 04/21/2020   K 4.7 04/21/2020   CL 101 04/21/2020   CREATININE 0.60 04/21/2020   BUN 13 04/21/2020   CO2 31 04/21/2020   TSH 2.72 04/21/2020   INR 1.09 12/31/2015   HGBA1C 6.0 04/21/2020    No results found.  Assessment & Plan:   Alexandra Swanson was seen today for ear pain.  Diagnoses and all orders for this visit:  Otitic barotrauma, initial encounter- Examination of the ear is reassuring.  There is no foreign body.  Her symptoms have resolved.  ANXIETY DISORDER, GENERALIZED -     ALPRAZolam (XANAX) 0.25 MG tablet; TAKE ONE TABLET BY MOUTH TWICE A DAY AS NEEDED FOR ANXIETY  Non-seasonal allergic rhinitis due to pollen -     montelukast (SINGULAIR) 10 MG tablet; Take 1 tablet (10 mg total) by mouth daily.  Essential hypertension- Her blood pressure is adequately well controlled.   I have changed Alexandra Swanson "Alexandra Swanson"'s montelukast. I am also having her maintain her aspirin, fexofenadine, Biotin, valACYclovir, azelastine, Diclofenac Sodium, atorvastatin, carvedilol, olmesartan, and ALPRAZolam.  Meds ordered this encounter  Medications   ALPRAZolam (XANAX) 0.25 MG tablet    Sig: TAKE ONE TABLET BY MOUTH TWICE A DAY AS NEEDED FOR ANXIETY     Dispense:  30 tablet    Refill:  5   montelukast (SINGULAIR) 10 MG tablet    Sig: Take 1 tablet (10 mg total) by mouth daily.    Dispense:  90 tablet    Refill:  1     Follow-up: Return in about 6 months (around 10/26/2021).  Sanda Linger, MD

## 2021-04-27 NOTE — Patient Instructions (Signed)
Ear Barotrauma, Adult Ear barotrauma is an injury to the middle ear. This happens when there is a change in air pressure. What are the causes? Flying in an airplane. Going deeper or coming to the surface too quickly when scuba diving. Going to a high place (high altitude) quickly. Being too close to an explosion or blast. What increases the risk? An infection in your middle ear. A sinus infection. A cold. An allergy to something around you. Having small tubes that lead from the middle ear to the back of the nose (eustachian tubes). Recent ear surgery. What are the signs or symptoms? Symptoms of this condition include: Ear pain. Sudden loss of hearing. This may be partial or complete. A feeling that your ear is clogged. Ringing in your ears. Other symptoms may include: Dizziness that creates a spinning, rocking, or tumbling feeling. Loss of balance. Feeling like you may vomit (nausea). Headache. Pain in your face. How is this treated? Medicines. Using a method to "pop" your ears or make the pressure in the middle ear the same as in the outer ear. Ways to do this include: Yawning. Chewing gum. Swallowing. Holding your nose closed and gently blowing. Surgery. This is done in very bad cases. Follow these instructions at home: Medicines Take over-the-counter and prescription medicines only as told by your doctor. Do not put anything into your ears to clean or unplug them. Activity Until your doctor says it is okay: Do not travel to high places. Do not fly in an airplane. Do not scuba dive. How is this prevented? Avoid doing things that cause pressure changes when you have symptoms of a cold or a stuffy nose. If you have a stuffy nose and will be flying, use a medicine that helps a stuffy nose (nasal decongestant) about 30-60 minutes before flying. When you fly: Chew gum. Swallow often and swallow hard during takeoff and landing. Hold your nose closed and gently blow to  "pop" your ears. Yawn during air pressure changes. If scuba diving, dive feet first and "pop" your ears often as you go deeper in the water. Contact a doctor if: Your symptoms get worse. Your symptoms do not get better. You have new symptoms. You have a fever. Get help right away if: You have very bad ear pain. You have a very bad headache. You are very dizzy. You have blood or pus coming from your ear. You have very bad balance problems. You cannot move or feel part of your face. Summary Ear barotrauma is an injury to the middle ear. This condition may be treated with medicines or by "popping" your ears. You can take steps to help prevent this condition. This information is not intended to replace advice given to you by your health care provider. Make sure you discuss any questions you have with your health care provider. Document Revised: 08/04/2019 Document Reviewed: 08/04/2019 Elsevier Patient Education  2022 [employer].

## 2021-04-28 ENCOUNTER — Ambulatory Visit: Payer: BC Managed Care – PPO | Admitting: Internal Medicine

## 2021-05-16 ENCOUNTER — Ambulatory Visit: Payer: BC Managed Care – PPO | Admitting: Internal Medicine

## 2021-05-17 ENCOUNTER — Other Ambulatory Visit: Payer: Self-pay | Admitting: Internal Medicine

## 2021-05-17 DIAGNOSIS — J0100 Acute maxillary sinusitis, unspecified: Secondary | ICD-10-CM

## 2021-05-17 MED ORDER — FLUCONAZOLE 150 MG PO TABS: 150.0000 mg | ORAL_TABLET | Freq: Once | ORAL | 3 refills | Status: AC

## 2021-05-17 MED ORDER — FLUCONAZOLE 150 MG PO TABS: 150.0000 mg | ORAL_TABLET | Freq: Once | ORAL | 3 refills | Status: DC

## 2021-05-17 MED ORDER — AMOXICILLIN-POT CLAVULANATE 875-125 MG PO TABS: 1.0000 | ORAL_TABLET | Freq: Two times a day (BID) | ORAL | 0 refills | Status: DC

## 2021-05-17 MED ORDER — AMOXICILLIN-POT CLAVULANATE 875-125 MG PO TABS: 1.0000 | ORAL_TABLET | Freq: Two times a day (BID) | ORAL | 0 refills | Status: AC

## 2021-05-26 ENCOUNTER — Encounter: Payer: Self-pay | Admitting: Internal Medicine

## 2021-06-02 ENCOUNTER — Ambulatory Visit: Payer: BC Managed Care – PPO | Admitting: Internal Medicine

## 2021-06-21 ENCOUNTER — Encounter: Payer: Self-pay | Admitting: Internal Medicine

## 2021-06-21 DIAGNOSIS — J301 Allergic rhinitis due to pollen: Secondary | ICD-10-CM

## 2021-06-21 MED ORDER — MONTELUKAST SODIUM 10 MG PO TABS: 10.0000 mg | ORAL_TABLET | Freq: Every day | ORAL | 1 refills | Status: DC

## 2021-07-04 ENCOUNTER — Ambulatory Visit: Payer: Self-pay | Admitting: Internal Medicine

## 2021-07-11 ENCOUNTER — Telehealth: Payer: Self-pay | Admitting: Cardiology

## 2021-07-11 DIAGNOSIS — I10 Essential (primary) hypertension: Secondary | ICD-10-CM

## 2021-07-11 MED ORDER — OLMESARTAN MEDOXOMIL-HCTZ 20-12.5 MG PO TABS: 1.0000 | ORAL_TABLET | ORAL | 3 refills | Status: DC

## 2021-07-11 NOTE — Telephone Encounter (Signed)
ICD-10-CM   ?1. Essential hypertension  I10 olmesartan-hydrochlorothiazide (BENICAR HCT) 20-12.5 MG tablet  ?  ? ?Meds ordered this encounter  ?Medications  ? olmesartan-hydrochlorothiazide (BENICAR HCT) 20-12.5 MG tablet  ?  Sig: Take 1 tablet by mouth every morning.  ?  Dispense:  100 tablet  ?  Refill:  3  ?  ?Medications Discontinued During This Encounter  ?Medication Reason  ? olmesartan (BENICAR) 20 MG tablet Change in therapy  ?  ?Patient feels better without bloating with B-HCT ?

## 2021-07-27 ENCOUNTER — Other Ambulatory Visit: Payer: Self-pay | Admitting: Neurology

## 2021-07-27 DIAGNOSIS — E785 Hyperlipidemia, unspecified: Secondary | ICD-10-CM

## 2021-07-27 DIAGNOSIS — E669 Obesity, unspecified: Secondary | ICD-10-CM

## 2021-07-27 DIAGNOSIS — I10 Essential (primary) hypertension: Secondary | ICD-10-CM

## 2021-07-27 MED ORDER — WEGOVY 0.25 MG/0.5ML ~~LOC~~ SOAJ: 0.2500 mg | SUBCUTANEOUS | 0 refills | Status: DC

## 2021-08-17 ENCOUNTER — Ambulatory Visit: Payer: Self-pay | Admitting: Internal Medicine

## 2021-08-21 ENCOUNTER — Other Ambulatory Visit: Payer: Self-pay | Admitting: Internal Medicine

## 2021-08-21 DIAGNOSIS — J301 Allergic rhinitis due to pollen: Secondary | ICD-10-CM

## 2021-08-30 DIAGNOSIS — Z01419 Encounter for gynecological examination (general) (routine) without abnormal findings: Secondary | ICD-10-CM | POA: Diagnosis not present

## 2021-08-30 DIAGNOSIS — Z1231 Encounter for screening mammogram for malignant neoplasm of breast: Secondary | ICD-10-CM | POA: Diagnosis not present

## 2021-08-30 LAB — HM MAMMOGRAPHY

## 2021-10-07 ENCOUNTER — Encounter: Payer: Self-pay | Admitting: Cardiology

## 2021-10-07 ENCOUNTER — Ambulatory Visit: Payer: BC Managed Care – PPO | Admitting: Cardiology

## 2021-10-07 VITALS — BP 114/75 | HR 73 | Temp 97.1°F | Resp 16 | Ht 67.0 in | Wt 143.0 lb

## 2021-10-07 DIAGNOSIS — R739 Hyperglycemia, unspecified: Secondary | ICD-10-CM | POA: Diagnosis not present

## 2021-10-07 DIAGNOSIS — E7800 Pure hypercholesterolemia, unspecified: Secondary | ICD-10-CM | POA: Diagnosis not present

## 2021-10-07 DIAGNOSIS — I2510 Atherosclerotic heart disease of native coronary artery without angina pectoris: Secondary | ICD-10-CM

## 2021-10-07 DIAGNOSIS — I10 Essential (primary) hypertension: Secondary | ICD-10-CM | POA: Diagnosis not present

## 2021-10-07 NOTE — Progress Notes (Signed)
Primary Physician/Referring:  Alexandra Lima, MD  Patient ID: Alexandra Swanson, female    DOB: [birth year], 56 y.o.   MRN: [id number]  No chief complaint on file.  HPI:    HPI: Alexandra Swanson  is a 56 y.o. with acute anterior ST elevation on 12/31/2015 S/P primary angioplasty to mid LAD and very mild residual circumflex disease presents here for annual visit.  She is essentially asymptomatic.   Past Medical History:  Diagnosis Date   Anxiety    Heart attack ([place]) 12/31/2015   Hyperlipidemia    Hypertension    Past Surgical History:  Procedure Laterality Date   CARDIAC CATHETERIZATION N/A 12/31/2015   Procedure: Left Heart Cath and Coronary Angiography;  Surgeon: Adrian Prows, MD;  Location: [facility];  Service: Cardiovascular;  Laterality: N/A;   CARDIAC CATHETERIZATION N/A 12/31/2015   Procedure: Coronary Stent Intervention;  Surgeon: Adrian Prows, MD;  Location: [facility];  Service: Cardiovascular;  Laterality: N/A;   CESAREAN SECTION  1999   CHOLECYSTECTOMY  2010   Social History   Tobacco Use   Smoking status: Never   Smokeless tobacco: Never  Substance Use Topics   Alcohol use: Yes    Alcohol/week: 12.0 standard drinks    Types: 7 Glasses of wine, 5 Cans of beer per week    Comment: social  Marital Status: Divorced    ROS  Review of Systems  Cardiovascular:  Negative for chest pain, dyspnea on exertion and leg swelling.  Gastrointestinal:  Negative for melena.  Objective  Blood pressure 114/75, pulse 73, temperature (!) 97.1 F (36.2 C), temperature source Temporal, resp. rate 16, height 5' 7" (1.702 m), weight 143 lb (64.9 kg), SpO2 99 %. Body mass index is 22.4 kg/m.      10/07/2021    3:25 PM 04/27/2021    9:17 AM 01/06/2021    3:06 PM  Vitals with BMI  Height 5' 7" 5' 7" 5' 7"  Weight 143 lbs 144 lbs 142 lbs  BMI 22.39 54.27 06.23  Systolic 762 831 517  Diastolic 75 80 82  Pulse 73 66 72    Physical Exam Neck:     Vascular: No JVD.   Cardiovascular:     Rate and Rhythm: Normal rate and regular rhythm.     Pulses: Intact distal pulses.     Heart sounds: Normal heart sounds. No murmur heard.   No gallop.  Pulmonary:     Effort: Pulmonary effort is normal.     Breath sounds: Normal breath sounds.  Abdominal:     General: Bowel sounds are normal.     Palpations: Abdomen is soft.  Musculoskeletal:     Right lower leg: No edema.     Left lower leg: No edema.   Radiology: No results found.  Laboratory examination:      Latest Ref Rng & Units 04/21/2020    8:57 AM 04/22/2019    9:13 AM 03/25/2018    1:37 PM  CMP  Glucose 70 - 99 mg/dL 101   96   105    BUN 6 - 23 mg/dL _0 Creatinine 0.40 - 1.20 mg/dL 0.60   0.67   0.52    Sodium 135 - 145 mEq/L 139   138   138    Potassium 3.5 - 5.1 mEq/L 4.7   4.5   4.2    Chloride 96 - 112 mEq/L 101  103   100    CO2 19 - 32 mEq/L _0 Calcium 8.4 - 10.5 mg/dL 9.6   9.4   9.6    Total Protein 6.0 - 8.3 g/dL 7.6   7.3   7.5    Total Bilirubin 0.2 - 1.2 mg/dL 0.5   0.6   0.5    Alkaline Phos 39 - 117 U/L 61   53   53    AST 0 - 37 U/L _1 ALT 0 - 35 U/L _2 Latest Ref Rng & Units 04/21/2020    8:57 AM 04/22/2019    9:13 AM 03/25/2018    1:37 PM  CBC  WBC 4.0 - 10.5 K/uL 5.5   7.6   7.3    Hemoglobin 12.0 - 15.0 g/dL 12.6   13.7   12.8    Hematocrit 36.0 - 46.0 % 37.9   41.7   38.6    Platelets 150.0 - 400.0 K/uL 222.0   212.0   234.0     Lipid Panel     Component Value Date/Time   CHOL 150 04/21/2020 0857   TRIG 65.0 04/21/2020 0857   HDL 71.50 04/21/2020 0857   CHOLHDL 2 04/21/2020 0857   VLDL 13.0 04/21/2020 0857   LDLCALC 65 04/21/2020 0857   HEMOGLOBIN A1C Lab Results  Component Value Date   HGBA1C 6.0 04/21/2020   MPG 108 12/31/2015   TSH No results for input(s): TSH in the last 8760 hours.  Medications   Allergies  Allergen Reactions   Clarithromycin Nausea Only     Current Outpatient  Medications:    ALPRAZolam (XANAX) 0.25 MG tablet, TAKE ONE TABLET BY MOUTH TWICE A DAY AS NEEDED FOR ANXIETY, Disp: 30 tablet, Rfl: 5   aspirin 81 MG tablet, Take 81 mg by mouth daily., Disp: , Rfl:    atorvastatin (LIPITOR) 80 MG tablet, Take 1 tablet (80 mg) by mouth daily., Disp: 90 tablet, Rfl: 3   azelastine (ASTELIN) 0.1 % nasal spray, Instill 1 spray into each nostril twice daily as directed., Disp: 30 mL, Rfl: 1   Biotin 1000 MCG tablet, Take 1,000 mcg by mouth daily., Disp: , Rfl:    carvedilol (COREG) 6.25 MG tablet, Take 1 tablet (6.25 mg total) by mouth 2 (two) times daily with a meal., Disp: 180 tablet, Rfl: 3   fexofenadine (ALLEGRA) 180 MG tablet, Take 180 mg by mouth daily., Disp: , Rfl:    montelukast (SINGULAIR) 10 MG tablet, Take 1 tablet by mouth daily., Disp: 90 tablet, Rfl: 0   olmesartan-hydrochlorothiazide (BENICAR HCT) 20-12.5 MG tablet, Take 1 tablet by mouth every morning., Disp: 100 tablet, Rfl: 3   valACYclovir (VALTREX) 1000 MG tablet, as needed., Disp: , Rfl:    Cardiac Studies:   Coronary Angiogram  [12/31/2015]: CAD S/P Stent from prox to mid 3.5x12, 3.5x18 and 3.5x12 mm Resolute DES. Mild disease Cx. Spasm in large RV branch improved with IC NTG. LVEF 35%  Treadmill stress test  [02/18/2016]:  The resting electrocardiogram demonstrated normal sinus rhythm, no resting arrhythmias and normal rest repolarization. The stress electrocardiogram was normal. There were no significant arrhythmias. Patient exercised on Bruce protocol for 10:01 minutes and achieved 95% of Max Predicted HR (Target HR was >85% MPHR) and 11.84 METS. Stress symptoms included fatigue and dyspnea. Normal BP  response. Exercise capacity was excellent . Impression: Normal stress EKG. No significant arrhythmias. Normal BP response.  Echocardiogram  [04/12/2016]: 1. Left ventricle cavity is normal in size. Visual EF is 55- 60%. Minimal apical septal hypokinesia. Normal diastolic filling pattern,  normal LAP. Calculated EF 65%. 2. Left atrial cavity is normal in size. 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 3. Mild (Grade I) aortic regurgitation. 4. Mild (Grade I) mitral regurgitation. 5. Trace tricuspid regurgitation.  EKG: EKG 10/07/2021: Normal sinus rhythm at rate of 67 bpm, normal EKG.    Assessment     ICD-10-CM   1. Coronary artery disease involving native coronary artery of native heart without angina pectoris  I25.10 EKG 12-Lead    Lipid Panel With LDL/HDL Ratio    Hgb A1c w/o eAG    CBC    CMP14+EGFR    2. Essential hypertension  I10     3. Hypercholesteremia  E78.00     4. Hyperglycemia  R73.9 Hgb A1c w/o eAG       Recommendations:   Oniya Mandarino  is a 56 y.o. Caucasian female patient with acute anterior ST elevation on 12/31/2015 S/P primary angioplasty to mid LAD and very mild residual circumflex disease presents here for annual visit.  She is essentially asymptomatic.  Blood pressure is well controlled since being on olmesartan HCT.  She needs routine labs for evaluation of her lipids and also hyperglycemia and I will forward a copy to Dr. Scarlette Calico as well.  She has been walking on a daily basis without any chest pain or dyspnea however I may consider doing a routine treadmill stress test maybe next year or sooner if she has any new symptoms.   Adrian Prows, MD, [facility] 10/07/2021, 3:33 PM Office: [phone] Fax: [phone] Pager: [phone]

## 2021-10-07 NOTE — Progress Notes (Signed)
EKG 10/07/2021: Normal sinus rhythm at rate of 67 bpm, normal EKG.

## 2021-10-08 ENCOUNTER — Other Ambulatory Visit: Payer: Self-pay | Admitting: Cardiology

## 2021-10-08 DIAGNOSIS — I10 Essential (primary) hypertension: Secondary | ICD-10-CM

## 2021-10-08 DIAGNOSIS — I2510 Atherosclerotic heart disease of native coronary artery without angina pectoris: Secondary | ICD-10-CM

## 2021-10-17 DIAGNOSIS — R102 Pelvic and perineal pain: Secondary | ICD-10-CM | POA: Diagnosis not present

## 2021-10-17 DIAGNOSIS — N342 Other urethritis: Secondary | ICD-10-CM | POA: Diagnosis not present

## 2021-10-27 DIAGNOSIS — I2510 Atherosclerotic heart disease of native coronary artery without angina pectoris: Secondary | ICD-10-CM | POA: Diagnosis not present

## 2021-10-28 LAB — HGB A1C W/O EAG: Hgb A1c MFr Bld: 5.8 % — ABNORMAL HIGH (ref 4.8–5.6)

## 2021-10-28 LAB — CMP14+EGFR
ALT: 25 IU/L (ref 0–32)
AST: 19 IU/L (ref 0–40)
Albumin/Globulin Ratio: 2.4 — ABNORMAL HIGH (ref 1.2–2.2)
Albumin: 4.7 g/dL (ref 3.8–4.9)
Alkaline Phosphatase: 64 IU/L (ref 44–121)
BUN/Creatinine Ratio: 21 (ref 9–23)
BUN: 12 mg/dL (ref 6–24)
Bilirubin Total: 0.6 mg/dL (ref 0.0–1.2)
CO2: 26 mmol/L (ref 20–29)
Calcium: 9.5 mg/dL (ref 8.7–10.2)
Chloride: 95 mmol/L — ABNORMAL LOW (ref 96–106)
Creatinine, Ser: 0.56 mg/dL — ABNORMAL LOW (ref 0.57–1.00)
Globulin, Total: 2 g/dL (ref 1.5–4.5)
Glucose: 106 mg/dL — ABNORMAL HIGH (ref 70–99)
Potassium: 4.4 mmol/L (ref 3.5–5.2)
Sodium: 134 mmol/L (ref 134–144)
Total Protein: 6.7 g/dL (ref 6.0–8.5)
eGFR: 108 mL/min/{1.73_m2} (ref >59)

## 2021-10-28 LAB — CBC
Hematocrit: 36.5 % (ref 34.0–46.6)
Hemoglobin: 12.4 g/dL (ref 11.1–15.9)
MCH: 29.6 pg (ref 26.6–33.0)
MCHC: 34 g/dL (ref 31.5–35.7)
MCV: 87 fL (ref 79–97)
Platelets: 203 10*3/uL (ref 150–450)
RBC: 4.19 x10E6/uL (ref 3.77–5.28)
RDW: 12.8 % (ref 11.7–15.4)
WBC: 5.3 10*3/uL (ref 3.4–10.8)

## 2021-10-28 LAB — LIPID PANEL WITH LDL/HDL RATIO
Cholesterol, Total: 139 mg/dL (ref 100–199)
HDL: 83 mg/dL (ref >39)
LDL Chol Calc (NIH): 43 mg/dL (ref 0–99)
LDL/HDL Ratio: 0.5 ratio (ref 0.0–3.2)
Triglycerides: 64 mg/dL (ref 0–149)
VLDL Cholesterol Cal: 13 mg/dL (ref 5–40)

## 2021-10-31 ENCOUNTER — Other Ambulatory Visit: Payer: Self-pay | Admitting: Internal Medicine

## 2021-10-31 DIAGNOSIS — J0100 Acute maxillary sinusitis, unspecified: Secondary | ICD-10-CM

## 2021-10-31 DIAGNOSIS — J301 Allergic rhinitis due to pollen: Secondary | ICD-10-CM

## 2021-10-31 MED ORDER — AZELASTINE HCL 0.1 % NA SOLN: NASAL | 1 refills | Status: DC

## 2021-10-31 MED ORDER — METHYLPREDNISOLONE 4 MG PO TBPK: ORAL_TABLET | ORAL | 0 refills | Status: DC

## 2021-10-31 MED ORDER — FLUTICASONE PROPIONATE 50 MCG/ACT NA SUSP: 2.0000 | Freq: Every day | NASAL | 3 refills | Status: DC

## 2021-10-31 MED ORDER — METHYLPREDNISOLONE 4 MG PO TBPK: ORAL_TABLET | ORAL | 0 refills | Status: AC

## 2021-10-31 MED ORDER — AMOXICILLIN-POT CLAVULANATE 875-125 MG PO TABS: 1.0000 | ORAL_TABLET | Freq: Two times a day (BID) | ORAL | 0 refills | Status: AC

## 2021-11-02 ENCOUNTER — Ambulatory Visit (INDEPENDENT_AMBULATORY_CARE_PROVIDER_SITE_OTHER): Payer: BC Managed Care – PPO

## 2021-11-02 ENCOUNTER — Encounter: Payer: Self-pay | Admitting: Internal Medicine

## 2021-11-02 ENCOUNTER — Ambulatory Visit (INDEPENDENT_AMBULATORY_CARE_PROVIDER_SITE_OTHER): Payer: BC Managed Care – PPO | Admitting: Internal Medicine

## 2021-11-02 ENCOUNTER — Other Ambulatory Visit: Payer: Self-pay | Admitting: Cardiology

## 2021-11-02 VITALS — BP 136/84 | HR 90 | Temp 98.0°F | Resp 16 | Wt 142.0 lb

## 2021-11-02 DIAGNOSIS — I10 Essential (primary) hypertension: Secondary | ICD-10-CM | POA: Diagnosis not present

## 2021-11-02 DIAGNOSIS — J4520 Mild intermittent asthma, uncomplicated: Secondary | ICD-10-CM | POA: Insufficient documentation

## 2021-11-02 DIAGNOSIS — R052 Subacute cough: Secondary | ICD-10-CM | POA: Diagnosis not present

## 2021-11-02 DIAGNOSIS — R059 Cough, unspecified: Secondary | ICD-10-CM | POA: Diagnosis not present

## 2021-11-02 DIAGNOSIS — E7800 Pure hypercholesterolemia, unspecified: Secondary | ICD-10-CM

## 2021-11-02 MED ORDER — TRELEGY ELLIPTA 100-62.5-25 MCG/ACT IN AEPB: 1.0000 | INHALATION_SPRAY | Freq: Every day | RESPIRATORY_TRACT | 1 refills | Status: DC

## 2021-11-02 NOTE — Progress Notes (Signed)
Subjective:  Patient ID: Alexandra Swanson, female    DOB: 11-26-1965  Age: 56 y.o. MRN: [id number]  CC: Hypertension and URI   HPI Alexandra Swanson presents for f/up -  She contacted me a few days ago complaining of right facial pain, right ear pain, and dizziness.  I have prescribed Augmentin for acute sinusitis and Medrol Dosepak for eustachian tube dysfunction.  She walked about 3 or 4 miles this morning and did not have any exertional symptoms such as chest pain or shortness of breath.  After the walk she complained of dizziness, lightheadedness, and a nonproductive cough.  Outpatient Medications Prior to Visit  Medication Sig Dispense Refill   ALPRAZolam (XANAX) 0.25 MG tablet TAKE ONE TABLET BY MOUTH TWICE A DAY AS NEEDED FOR ANXIETY 30 tablet 5   amoxicillin-clavulanate (AUGMENTIN) 875-125 MG tablet Take 1 tablet by mouth 2 (two) times daily for 10 days. 20 tablet 0   aspirin 81 MG tablet Take 81 mg by mouth daily.     azelastine (ASTELIN) 0.1 % nasal spray Instill 1 spray into each nostril twice daily as directed. 30 mL 1   Biotin 1000 MCG tablet Take 1,000 mcg by mouth daily.     carvedilol (COREG) 6.25 MG tablet Take 1 tablet (6.25 mg total) by mouth twice daily with meals. 180 tablet 2   fexofenadine (ALLEGRA) 180 MG tablet Take 180 mg by mouth daily.     fluticasone (FLONASE) 50 MCG/ACT nasal spray Place 2 sprays into both nostrils daily. 16 g 3   methylPREDNISolone (MEDROL DOSEPAK) 4 MG TBPK tablet TAKE AS DIRECTED 21 tablet 0   montelukast (SINGULAIR) 10 MG tablet Take 1 tablet by mouth daily. 90 tablet 0   olmesartan-hydrochlorothiazide (BENICAR HCT) 20-12.5 MG tablet Take 1 tablet by mouth every morning. 100 tablet 3   valACYclovir (VALTREX) 1000 MG tablet as needed.     atorvastatin (LIPITOR) 80 MG tablet Take 1 tablet (80 mg) by mouth daily. 90 tablet 3   No facility-administered medications prior to visit.    ROS Review of Systems  Constitutional: Negative.  Negative  for chills, diaphoresis, fatigue and fever.  HENT:  Positive for postnasal drip, rhinorrhea, sinus pressure and sinus pain. Negative for congestion, nosebleeds, sore throat, tinnitus, trouble swallowing and voice change.   Eyes: Negative.   Respiratory:  Positive for cough. Negative for chest tightness, shortness of breath and wheezing.   Cardiovascular:  Negative for chest pain, palpitations and leg swelling.  Gastrointestinal: Negative.  Negative for abdominal pain, constipation, diarrhea, nausea and vomiting.  Genitourinary: Negative.  Negative for difficulty urinating.  Musculoskeletal: Negative.   Skin: Negative.   Neurological:  Positive for dizziness and light-headedness. Negative for syncope, speech difficulty, weakness and headaches.  Hematological:  Negative for adenopathy. Does not bruise/bleed easily.  Psychiatric/Behavioral: Negative.      Objective:  BP 136/84 (BP Location: Right Arm, Patient Position: Sitting, Cuff Size: Normal)   Pulse 90   Temp 98 F (36.7 C) (Oral)   Resp 16   Wt 142 lb (64.4 kg)   SpO2 95%   BMI 22.24 kg/m   BP Readings from Last 3 Encounters:  11/02/21 136/84  10/07/21 114/75  04/27/21 124/80    Wt Readings from Last 3 Encounters:  11/02/21 142 lb (64.4 kg)  10/07/21 143 lb (64.9 kg)  04/27/21 144 lb (65.3 kg)    Physical Exam Vitals reviewed.  HENT:     Right Ear: Hearing, tympanic membrane, ear canal and external  ear normal. No middle ear effusion. Tympanic membrane is not erythematous.     Left Ear: Hearing, tympanic membrane, ear canal and external ear normal.  No middle ear effusion. Tympanic membrane is not erythematous.     Mouth/Throat:     Mouth: Mucous membranes are moist.  Eyes:     General: No scleral icterus.    Conjunctiva/sclera: Conjunctivae normal.  Cardiovascular:     Rate and Rhythm: Normal rate and regular rhythm. Occasional Extrasystoles are present.    Heart sounds: Normal heart sounds, S1 normal and S2  normal. No murmur heard.    No friction rub. No gallop.     Comments: SR with PAC's No ST/T wave changes or Q waves No LVH uchanged Pulmonary:     Effort: Pulmonary effort is normal.     Breath sounds: No stridor. No wheezing, rhonchi or rales.  Abdominal:     General: Abdomen is flat.     Palpations: There is no mass.     Tenderness: There is no abdominal tenderness. There is no guarding.     Hernia: No hernia is present.  Musculoskeletal:        General: Normal range of motion.     Cervical back: Neck supple.     Right lower leg: No edema.  Lymphadenopathy:     Cervical: No cervical adenopathy.  Skin:    General: Skin is warm and dry.     Coloration: Skin is not pale.  Neurological:     General: No focal deficit present.     Mental Status: She is alert. Mental status is at baseline.  Psychiatric:        Mood and Affect: Mood normal.        Behavior: Behavior normal.     Lab Results  Component Value Date   WBC 5.3 10/27/2021   HGB 12.4 10/27/2021   HCT 36.5 10/27/2021   PLT 203 10/27/2021   GLUCOSE 106 (H) 10/27/2021   CHOL 139 10/27/2021   TRIG 64 10/27/2021   HDL 83 10/27/2021   LDLCALC 43 10/27/2021   ALT 25 10/27/2021   AST 19 10/27/2021   NA 134 10/27/2021   K 4.4 10/27/2021   CL 95 (L) 10/27/2021   CREATININE 0.56 (L) 10/27/2021   BUN 12 10/27/2021   CO2 26 10/27/2021   TSH 2.72 04/21/2020   INR 1.09 12/31/2015   HGBA1C 5.8 (H) 10/27/2021    No results found.   Assessment & Plan:   Alexandra Swanson was seen today for hypertension and uri.  Diagnoses and all orders for this visit:  Subacute cough- Her chest x-ray is negative for mass or infiltrate.  Will treat for cough variant asthma. -     DG Chest 2 View; Future  Mild intermittent asthma without complication -     Fluticasone-Umeclidin-Vilant (TRELEGY ELLIPTA) 100-62.5-25 MCG/ACT AEPB; Inhale 1 puff into the lungs daily.  Essential hypertension- Her blood pressure is well controlled and her EKG is  reassuring.   I am having Alexandra Swanson "Alexandra Swanson" start on Trelegy Ellipta. I am also having her maintain her aspirin, fexofenadine, Biotin, valACYclovir, ALPRAZolam, olmesartan-hydrochlorothiazide, montelukast, carvedilol, amoxicillin-clavulanate, methylPREDNISolone, fluticasone, and azelastine.  Meds ordered this encounter  Medications   Fluticasone-Umeclidin-Vilant (TRELEGY ELLIPTA) 100-62.5-25 MCG/ACT AEPB    Sig: Inhale 1 puff into the lungs daily.    Dispense:  120 each    Refill:  1     Follow-up: Return if symptoms worsen or fail to improve.  Maisie Fus  Ronnald Ramp, MD

## 2021-11-02 NOTE — Patient Instructions (Signed)

## 2021-11-03 ENCOUNTER — Other Ambulatory Visit: Payer: Self-pay | Admitting: Neurology

## 2021-11-03 ENCOUNTER — Telehealth: Payer: Self-pay | Admitting: Neurology

## 2021-11-03 MED ORDER — OXYCODONE HCL 5 MG PO CAPS: 10.0000 mg | ORAL_CAPSULE | ORAL | 0 refills | Status: DC | PRN

## 2021-11-03 NOTE — Telephone Encounter (Addendum)
Patient with severe sciatica, may need surgery, will give her 7 days of oxycodone for post-op pain

## 2021-11-22 ENCOUNTER — Other Ambulatory Visit: Payer: Self-pay | Admitting: Internal Medicine

## 2021-11-22 DIAGNOSIS — J301 Allergic rhinitis due to pollen: Secondary | ICD-10-CM

## 2021-12-22 ENCOUNTER — Other Ambulatory Visit: Payer: Self-pay | Admitting: Internal Medicine

## 2021-12-22 DIAGNOSIS — J301 Allergic rhinitis due to pollen: Secondary | ICD-10-CM

## 2021-12-23 ENCOUNTER — Other Ambulatory Visit: Payer: Self-pay | Admitting: Internal Medicine

## 2021-12-23 DIAGNOSIS — J301 Allergic rhinitis due to pollen: Secondary | ICD-10-CM

## 2021-12-23 DIAGNOSIS — J4520 Mild intermittent asthma, uncomplicated: Secondary | ICD-10-CM

## 2021-12-23 MED ORDER — MONTELUKAST SODIUM 10 MG PO TABS: 10.0000 mg | ORAL_TABLET | Freq: Every day | ORAL | 3 refills | Status: DC

## 2022-02-08 ENCOUNTER — Other Ambulatory Visit: Payer: Self-pay | Admitting: Internal Medicine

## 2022-02-08 DIAGNOSIS — J22 Unspecified acute lower respiratory infection: Secondary | ICD-10-CM | POA: Insufficient documentation

## 2022-02-08 MED ORDER — AMOXICILLIN-POT CLAVULANATE 875-125 MG PO TABS: 1.0000 | ORAL_TABLET | Freq: Two times a day (BID) | ORAL | 0 refills | Status: AC

## 2022-02-08 MED ORDER — AMOXICILLIN-POT CLAVULANATE 875-125 MG PO TABS: 1.0000 | ORAL_TABLET | Freq: Two times a day (BID) | ORAL | 0 refills | Status: DC

## 2022-02-14 DIAGNOSIS — Z23 Encounter for immunization: Secondary | ICD-10-CM | POA: Diagnosis not present

## 2022-02-26 ENCOUNTER — Other Ambulatory Visit: Payer: Self-pay | Admitting: Neurology

## 2022-02-26 MED ORDER — FLUCONAZOLE 150 MG PO TABS: 150.0000 mg | ORAL_TABLET | Freq: Every day | ORAL | 3 refills | Status: DC

## 2022-02-26 MED ORDER — VALACYCLOVIR HCL 500 MG PO TABS: 500.0000 mg | ORAL_TABLET | Freq: Two times a day (BID) | ORAL | 5 refills | Status: AC

## 2022-02-26 MED ORDER — AMOXICILLIN-POT CLAVULANATE 875-125 MG PO TABS: 1.0000 | ORAL_TABLET | Freq: Two times a day (BID) | ORAL | 0 refills | Status: DC

## 2022-02-26 MED ORDER — PREDNISONE 50 MG PO TABS: 50.0000 mg | ORAL_TABLET | Freq: Every day | ORAL | 2 refills | Status: DC

## 2022-03-05 ENCOUNTER — Other Ambulatory Visit: Payer: Self-pay | Admitting: Internal Medicine

## 2022-03-05 DIAGNOSIS — J4520 Mild intermittent asthma, uncomplicated: Secondary | ICD-10-CM

## 2022-03-05 DIAGNOSIS — J301 Allergic rhinitis due to pollen: Secondary | ICD-10-CM

## 2022-03-27 ENCOUNTER — Other Ambulatory Visit: Payer: Self-pay | Admitting: Internal Medicine

## 2022-03-27 DIAGNOSIS — H1033 Unspecified acute conjunctivitis, bilateral: Secondary | ICD-10-CM | POA: Insufficient documentation

## 2022-03-27 MED ORDER — NEOMYCIN-POLYMYXIN-DEXAMETH 3.5-10000-0.1 OP SUSP: 2.0000 [drp] | Freq: Four times a day (QID) | OPHTHALMIC | 0 refills | Status: DC

## 2022-07-03 ENCOUNTER — Other Ambulatory Visit: Payer: Self-pay | Admitting: Cardiology

## 2022-07-03 DIAGNOSIS — I10 Essential (primary) hypertension: Secondary | ICD-10-CM

## 2022-08-16 DIAGNOSIS — F4320 Adjustment disorder, unspecified: Secondary | ICD-10-CM | POA: Diagnosis not present

## 2022-08-30 DIAGNOSIS — F4320 Adjustment disorder, unspecified: Secondary | ICD-10-CM | POA: Diagnosis not present

## 2022-09-08 DIAGNOSIS — Z6822 Body mass index (BMI) 22.0-22.9, adult: Secondary | ICD-10-CM | POA: Diagnosis not present

## 2022-09-08 DIAGNOSIS — R87810 Cervical high risk human papillomavirus (HPV) DNA test positive: Secondary | ICD-10-CM | POA: Diagnosis not present

## 2022-09-08 DIAGNOSIS — Z124 Encounter for screening for malignant neoplasm of cervix: Secondary | ICD-10-CM | POA: Diagnosis not present

## 2022-09-08 DIAGNOSIS — Z1231 Encounter for screening mammogram for malignant neoplasm of breast: Secondary | ICD-10-CM | POA: Diagnosis not present

## 2022-09-08 DIAGNOSIS — Z01419 Encounter for gynecological examination (general) (routine) without abnormal findings: Secondary | ICD-10-CM | POA: Diagnosis not present

## 2022-09-11 DIAGNOSIS — F4320 Adjustment disorder, unspecified: Secondary | ICD-10-CM | POA: Diagnosis not present

## 2022-09-16 ENCOUNTER — Other Ambulatory Visit: Payer: Self-pay | Admitting: Neurology

## 2022-09-16 MED ORDER — NEOMYCIN-POLYMYXIN-DEXAMETH 3.5-10000-0.1 OP SUSP: 2.0000 [drp] | Freq: Four times a day (QID) | OPHTHALMIC | 2 refills | Status: DC

## 2022-09-21 DIAGNOSIS — F4320 Adjustment disorder, unspecified: Secondary | ICD-10-CM | POA: Diagnosis not present

## 2022-10-06 ENCOUNTER — Ambulatory Visit: Payer: BC Managed Care – PPO | Admitting: Cardiology

## 2022-10-09 DIAGNOSIS — F4320 Adjustment disorder, unspecified: Secondary | ICD-10-CM | POA: Diagnosis not present

## 2022-10-12 ENCOUNTER — Other Ambulatory Visit: Payer: Self-pay | Admitting: Cardiology

## 2022-10-12 DIAGNOSIS — E7800 Pure hypercholesterolemia, unspecified: Secondary | ICD-10-CM

## 2022-10-18 ENCOUNTER — Encounter: Payer: Self-pay | Admitting: Cardiology

## 2022-10-18 ENCOUNTER — Ambulatory Visit: Payer: BC Managed Care – PPO | Admitting: Cardiology

## 2022-10-18 VITALS — BP 122/77 | HR 57 | Resp 16 | Ht 67.0 in | Wt 146.2 lb

## 2022-10-18 DIAGNOSIS — I10 Essential (primary) hypertension: Secondary | ICD-10-CM | POA: Diagnosis not present

## 2022-10-18 DIAGNOSIS — R739 Hyperglycemia, unspecified: Secondary | ICD-10-CM

## 2022-10-18 DIAGNOSIS — E7800 Pure hypercholesterolemia, unspecified: Secondary | ICD-10-CM | POA: Diagnosis not present

## 2022-10-18 DIAGNOSIS — I2510 Atherosclerotic heart disease of native coronary artery without angina pectoris: Secondary | ICD-10-CM

## 2022-10-18 NOTE — Progress Notes (Signed)
Primary Physician/Referring:  Etta Grandchild, MD  Patient ID: Alexandra Swanson, female    DOB: 01/20/1966, 57 y.o.   MRN: [id number]  Chief Complaint  Patient presents with   Coronary artery disease involving native coronary artery of   Follow-up    1 year   HPI:    HPI: Alexandra Swanson  is a 57 y.o. with acute anterior ST elevation on 12/31/2015 S/P primary angioplasty to mid LAD and very mild residual circumflex disease presents here for annual visit.  She is essentially asymptomatic.   Past Medical History:  Diagnosis Date   Anxiety    Heart attack ([facility]) 12/31/2015   Hyperlipidemia    Hypertension    Past Surgical History:  Procedure Laterality Date   CARDIAC CATHETERIZATION N/A 12/31/2015   Procedure: Left Heart Cath and Coronary Angiography;  Surgeon: Yates Decamp, MD;  Location: [facility];  Service: Cardiovascular;  Laterality: N/A;   CARDIAC CATHETERIZATION N/A 12/31/2015   Procedure: Coronary Stent Intervention;  Surgeon: Yates Decamp, MD;  Location: [facility];  Service: Cardiovascular;  Laterality: N/A;   CESAREAN SECTION  1999   CHOLECYSTECTOMY  2010   Social History   Tobacco Use   Smoking status: Never   Smokeless tobacco: Never  Substance Use Topics   Alcohol use: Yes    Alcohol/week: 12.0 standard drinks of alcohol    Types: 7 Glasses of wine, 5 Cans of beer per week    Comment: social  Marital Status: Divorced    ROS  Review of Systems  Cardiovascular:  Negative for chest pain, dyspnea on exertion and leg swelling.  Gastrointestinal:  Negative for melena.   Objective  Blood pressure 122/77, pulse (!) 57, resp. rate 16, height 5\' 7"  (1.702 m), weight 146 lb 3.2 oz (66.3 kg), SpO2 98 %. Body mass index is 22.9 kg/m.      10/18/2022   10:01 AM 11/02/2021   11:50 AM 10/07/2021    3:25 PM  Vitals with BMI  Height 5\' 7"   5\' 7"   Weight 146 lbs 3 oz 142 lbs 143 lbs  BMI 22.89  22.39  Systolic 122 136 409  Diastolic 77 84 75  Pulse 57 90 73     Physical Exam Neck:     Vascular: No JVD.  Cardiovascular:     Rate and Rhythm: Normal rate and regular rhythm.     Pulses: Intact distal pulses.     Heart sounds: Normal heart sounds. No murmur heard.    No gallop.  Pulmonary:     Effort: Pulmonary effort is normal.     Breath sounds: Normal breath sounds.  Abdominal:     General: Bowel sounds are normal.     Palpations: Abdomen is soft.  Musculoskeletal:     Right lower leg: No edema.     Left lower leg: No edema.    Radiology: No results found.  Laboratory examination:      Latest Ref Rng & Units 10/27/2021    8:45 AM 04/21/2020    8:57 AM 04/22/2019    9:13 AM  CMP  Glucose 70 - 99 mg/dL 811  914  96   BUN 6 - 24 mg/dL 12  13  17    Creatinine 0.57 - 1.00 mg/dL 7.82  9.56  2.13   Sodium 134 - 144 mmol/L 134  139  138   Potassium 3.5 - 5.2 mmol/L 4.4  4.7  4.5   Chloride 96 - 106 mmol/L  95  101  103   CO2 20 - 29 mmol/L 26  31  26    Calcium 8.7 - 10.2 mg/dL 9.5  9.6  9.4   Total Protein 6.0 - 8.5 g/dL 6.7  7.6  7.3   Total Bilirubin 0.0 - 1.2 mg/dL 0.6  0.5  0.6   Alkaline Phos 44 - 121 IU/L 64  61  53   AST 0 - 40 IU/L 19  16  17    ALT 0 - 32 IU/L 25  17  17        Latest Ref Rng & Units 10/27/2021    8:45 AM 04/21/2020    8:57 AM 04/22/2019    9:13 AM  CBC  WBC 3.4 - 10.8 x10E3/uL 5.3  5.5  7.6   Hemoglobin 11.1 - 15.9 g/dL 16.1  09.6  04.5   Hematocrit 34.0 - 46.6 % 36.5  37.9  41.7   Platelets 150 - 450 x10E3/uL 203  222.0  212.0    Lipid Panel     Component Value Date/Time   CHOL 139 10/27/2021 0845   TRIG 64 10/27/2021 0845   HDL 83 10/27/2021 0845   CHOLHDL 2 04/21/2020 0857   VLDL 13.0 04/21/2020 0857   LDLCALC 43 10/27/2021 0845   HEMOGLOBIN A1C Lab Results  Component Value Date   HGBA1C 5.8 (H) 10/27/2021   MPG 108 12/31/2015   Lab Results  Component Value Date   TSH 2.72 04/21/2020    Cardiac Studies:   Coronary Angiogram  [12/31/2015]: CAD S/P Stent from prox to mid 3.5x12,  3.5x18 and 3.5x12 mm Resolute DES. Mild disease Cx. Spasm in large RV branch improved with IC NTG. LVEF 35%  Treadmill stress test  [02/18/2016]:  The resting electrocardiogram demonstrated normal sinus rhythm, no resting arrhythmias and normal rest repolarization. The stress electrocardiogram was normal. There were no significant arrhythmias. Patient exercised on Bruce protocol for 10:01 minutes and achieved 95% of Max Predicted HR (Target HR was >85% MPHR) and 11.84 METS. Stress symptoms included fatigue and dyspnea. Normal BP response. Exercise capacity was excellent . Impression: Normal stress EKG. No significant arrhythmias. Normal BP response.  Echocardiogram  [04/12/2016]: 1. Left ventricle cavity is normal in size. Visual EF is 55- 60%. Minimal apical septal hypokinesia. Normal diastolic filling pattern, normal LAP. Calculated EF 65%. 2. Left atrial cavity is normal in size. 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 3. Mild (Grade I) aortic regurgitation. 4. Mild (Grade I) mitral regurgitation. 5. Trace tricuspid regurgitation.  EKG:  EKG 10/18/2022: Normal sinus rhythm at rate of 61 bpm, left atrial enlargement otherwise normal EKG.  No change compared to 10/07/2021.   Allergies & Medications   Allergies  Allergen Reactions   Clarithromycin Nausea Only    Current Outpatient Medications:    ALPRAZolam (XANAX) 0.25 MG tablet, TAKE ONE TABLET BY MOUTH TWICE A DAY AS NEEDED FOR ANXIETY, Disp: 30 tablet, Rfl: 5   aspirin 81 MG tablet, Take 81 mg by mouth daily., Disp: , Rfl:    atorvastatin (LIPITOR) 80 MG tablet, Take 1 tablet by mouth once daily., Disp: 90 tablet, Rfl: 1   Azelastine HCl 137 MCG/SPRAY SOLN, INSTILL 1 SPRAY INTO EACH NOSTRIL TWICE DAILY AS DIRECTED., Disp: 90 mL, Rfl: 1   Biotin 1000 MCG tablet, Take 1,000 mcg by mouth daily., Disp: , Rfl:    carvedilol (COREG) 6.25 MG tablet, Take 1 tablet (6.25 mg total) by mouth twice daily with  meals., Disp: 180 tablet, Rfl: 2   fexofenadine (ALLEGRA) 180 MG tablet, Take 180 mg by mouth daily., Disp: , Rfl:    montelukast (SINGULAIR) 10 MG tablet, Take 1 tablet by mouth daily., Disp: 90 tablet, Rfl: 1   neomycin-polymyxin b-dexamethasone (MAXITROL) 3.5-10000-0.1 SUSP, Place 2 drops into both eyes every 6 (six) hours., Disp: 5 mL, Rfl: 2   olmesartan-hydrochlorothiazide (BENICAR HCT) 20-12.5 MG tablet, Take 1 tablet by mouth every morning., Disp: 100 tablet, Rfl: 0   valACYclovir (VALTREX) 500 MG tablet, Take 1 tablet (500 mg total) by mouth 2 (two) times daily., Disp: 14 tablet, Rfl: 5   fluconazole (DIFLUCAN) 150 MG tablet, Take 1 tablet (150 mg total) by mouth daily. (Patient not taking: Reported on 10/18/2022), Disp: 7 tablet, Rfl: 3   Assessment     ICD-10-CM   1. Coronary artery disease involving native coronary artery of native heart without angina pectoris  I25.10 EKG 12-Lead    CBC    CMP14+EGFR    Lipid Panel With LDL/HDL Ratio    2. Essential hypertension  I10 VITAMIN D 25 Hydroxy (Vit-D Deficiency, Fractures)    3. Hypercholesteremia  E78.00     4. Hyperglycemia  R73.9 Hgb A1c w/o eAG      Recommendations:   Alexandra Swanson  is a 57 y.o. Caucasian female patient with acute anterior ST elevation on 12/31/2015 S/P primary angioplasty to mid LAD and very mild residual circumflex disease presents here for annual visit.  She is essentially asymptomatic. 1. Coronary artery disease involving native coronary artery of native heart without angina pectoris Patient completely asymptomatic, continues to remain active with regular walking, has not had any chest pain or dyspnea, no change in physical exam and EKG.  Do not think she needs any testing although her stent was >6 to 7 years ago. - EKG 12-Lead - CBC - CMP14+EGFR - Lipid Panel With LDL/HDL Ratio  2. Essential hypertension Blood pressure in excellent control with beta-blocker therapy and also ARB, continue the same.   Will also check vitamin D levels.  Routine lab orders placed today. - VITAMIN D 25 Hydroxy (Vit-D Deficiency, Fractures)  3. Hypercholesteremia Lipids have been under excellent control, she is on high intensity high-dose statin with Lipitor 80 mg daily, continue the same.  4. Hyperglycemia Very mild hyperglycemia, will check A1c. - Hgb A1c w/o eAG    Yates Decamp, MD, [facility] 10/18/2022, 10:34 AM Office: [phone] Fax: [phone] Pager: [phone]

## 2022-10-19 LAB — CMP14+EGFR
ALT: 24 IU/L (ref 0–32)
AST: 17 IU/L (ref 0–40)
Albumin/Globulin Ratio: 1.8
Albumin: 4.5 g/dL (ref 3.8–4.9)
Alkaline Phosphatase: 90 IU/L (ref 44–121)
BUN/Creatinine Ratio: 20 (ref 9–23)
BUN: 11 mg/dL (ref 6–24)
Bilirubin Total: 0.5 mg/dL (ref 0.0–1.2)
CO2: 28 mmol/L (ref 20–29)
Calcium: 9.7 mg/dL (ref 8.7–10.2)
Chloride: 100 mmol/L (ref 96–106)
Creatinine, Ser: 0.55 mg/dL — ABNORMAL LOW (ref 0.57–1.00)
Globulin, Total: 2.5 g/dL (ref 1.5–4.5)
Glucose: 109 mg/dL — ABNORMAL HIGH (ref 70–99)
Potassium: 4.5 mmol/L (ref 3.5–5.2)
Sodium: 140 mmol/L (ref 134–144)
Total Protein: 7 g/dL (ref 6.0–8.5)
eGFR: 108 mL/min/{1.73_m2} (ref >59)

## 2022-10-19 LAB — CBC
Hematocrit: 36.5 % (ref 34.0–46.6)
Hemoglobin: 12 g/dL (ref 11.1–15.9)
MCH: 29.1 pg (ref 26.6–33.0)
MCHC: 32.9 g/dL (ref 31.5–35.7)
MCV: 88 fL (ref 79–97)
Platelets: 223 10*3/uL (ref 150–450)
RBC: 4.13 x10E6/uL (ref 3.77–5.28)
RDW: 12.5 % (ref 11.7–15.4)
WBC: 6 10*3/uL (ref 3.4–10.8)

## 2022-10-19 LAB — HGB A1C W/O EAG: Hgb A1c MFr Bld: 5.9 % — ABNORMAL HIGH (ref 4.8–5.6)

## 2022-10-19 LAB — LIPID PANEL WITH LDL/HDL RATIO
Cholesterol, Total: 142 mg/dL (ref 100–199)
HDL: 78 mg/dL (ref >39)
LDL Chol Calc (NIH): 50 mg/dL (ref 0–99)
LDL/HDL Ratio: 0.6 ratio (ref 0.0–3.2)
Triglycerides: 69 mg/dL (ref 0–149)
VLDL Cholesterol Cal: 14 mg/dL (ref 5–40)

## 2022-10-19 LAB — VITAMIN D 25 HYDROXY (VIT D DEFICIENCY, FRACTURES): Vit D, 25-Hydroxy: 45.5 ng/mL (ref 30.0–100.0)

## 2022-11-12 ENCOUNTER — Other Ambulatory Visit: Payer: Self-pay | Admitting: Cardiology

## 2022-11-12 DIAGNOSIS — I2510 Atherosclerotic heart disease of native coronary artery without angina pectoris: Secondary | ICD-10-CM

## 2022-11-12 DIAGNOSIS — I10 Essential (primary) hypertension: Secondary | ICD-10-CM

## 2022-11-15 DIAGNOSIS — F4320 Adjustment disorder, unspecified: Secondary | ICD-10-CM | POA: Diagnosis not present

## 2022-11-27 ENCOUNTER — Other Ambulatory Visit: Payer: Self-pay | Admitting: Cardiology

## 2022-11-27 DIAGNOSIS — E7800 Pure hypercholesterolemia, unspecified: Secondary | ICD-10-CM

## 2022-11-29 ENCOUNTER — Ambulatory Visit: Payer: BC Managed Care – PPO | Admitting: Internal Medicine

## 2022-11-29 ENCOUNTER — Encounter: Payer: Self-pay | Admitting: Internal Medicine

## 2022-11-29 VITALS — BP 126/70 | HR 61 | Temp 97.9°F | Ht 67.0 in | Wt 144.0 lb

## 2022-11-29 DIAGNOSIS — F411 Generalized anxiety disorder: Secondary | ICD-10-CM

## 2022-11-29 DIAGNOSIS — I10 Essential (primary) hypertension: Secondary | ICD-10-CM | POA: Diagnosis not present

## 2022-11-29 DIAGNOSIS — H8303 Labyrinthitis, bilateral: Secondary | ICD-10-CM | POA: Insufficient documentation

## 2022-11-29 MED ORDER — ALPRAZOLAM 0.25 MG PO TABS
ORAL_TABLET | ORAL | 5 refills | Status: AC
Start: 2022-11-29 — End: ?

## 2022-11-29 MED ORDER — METHYLPREDNISOLONE 4 MG PO TBPK
ORAL_TABLET | ORAL | 0 refills | Status: AC
Start: 2022-11-29 — End: 2022-12-05

## 2022-11-29 NOTE — Progress Notes (Signed)
Subjective:  Patient ID: Alexandra Swanson, female    DOB: Sep 19, 1965  Age: 57 y.o. MRN: [id number]  CC: Hypertension   HPI Alexandra Swanson presents for f/up ----  Discussed the use of AI scribe software for clinical note transcription with the patient, who gave verbal consent to proceed.  History of Present Illness   The patient, with a 2-day history of sinus congestion, presents with a two-day history of vertigo. The symptoms began during a weekend trip to [place], where they experienced mild congestion. The vertigo was first noticed when they felt unsteady and unable to put one foot in front of the other while walking back to their room. Despite the initial episode being frightening, they managed to sleep through the night without further incident.  The following morning, the patient experienced another episode of vertigo after showering. They note that the vertigo is triggered when they change positions in bed, particularly when flipping from one side to the other. They also report sinus pain and congestion, but deny any ear pain, numbness, weakness, tingling, slurred speech, or balance issues.  The patient also experienced nausea and vomiting, which they attribute to the vertigo. They deny any fever, chills, vision or hearing problems. This is their first experience with vertigo, which they find distressing.       Outpatient Medications Prior to Visit  Medication Sig Dispense Refill   aspirin 81 MG tablet Take 81 mg by mouth daily.     Azelastine HCl 137 MCG/SPRAY SOLN INSTILL 1 SPRAY INTO EACH NOSTRIL TWICE DAILY AS DIRECTED. 90 mL 1   Biotin 1000 MCG tablet Take 1,000 mcg by mouth daily.     carvedilol (COREG) 6.25 MG tablet Take 1 tablet by mouth twice daily with meals. 180 tablet 1   fexofenadine (ALLEGRA) 180 MG tablet Take 180 mg by mouth daily.     fluconazole (DIFLUCAN) 150 MG tablet Take 1 tablet (150 mg total) by mouth daily. 7 tablet 3   montelukast (SINGULAIR) 10 MG tablet  Take 1 tablet by mouth daily. 90 tablet 1   neomycin-polymyxin b-dexamethasone (MAXITROL) 3.5-10000-0.1 SUSP Place 2 drops into both eyes every 6 (six) hours. 5 mL 2   olmesartan-hydrochlorothiazide (BENICAR HCT) 20-12.5 MG tablet Take 1 tablet by mouth every morning. 100 tablet 0   pravastatin (PRAVACHOL) 40 MG tablet Take 1 tablet (40 mg total) by mouth daily. 90 tablet 0   valACYclovir (VALTREX) 500 MG tablet Take 1 tablet (500 mg total) by mouth 2 (two) times daily. 14 tablet 5   ALPRAZolam (XANAX) 0.25 MG tablet TAKE ONE TABLET BY MOUTH TWICE A DAY AS NEEDED FOR ANXIETY 30 tablet 5   No facility-administered medications prior to visit.    ROS Review of Systems  Constitutional: Negative.  Negative for chills.  HENT:  Positive for congestion, postnasal drip, rhinorrhea and sinus pain. Negative for facial swelling, hearing loss, sinus pressure, sneezing, sore throat, trouble swallowing and voice change.   Eyes: Negative.   Respiratory:  Negative for cough, shortness of breath and wheezing.   Cardiovascular: Negative.  Negative for chest pain, palpitations and leg swelling.  Gastrointestinal:  Positive for nausea and vomiting. Negative for abdominal pain, constipation and diarrhea.  Genitourinary: Negative.  Negative for difficulty urinating.  Musculoskeletal: Negative.  Negative for arthralgias and neck pain.  Skin: Negative.   Neurological:  Positive for dizziness. Negative for speech difficulty, weakness, light-headedness, numbness and headaches.  Hematological:  Negative for adenopathy. Does not bruise/bleed easily.  Psychiatric/Behavioral:  The  patient is nervous/anxious.     Objective:  BP 126/70 (BP Location: Left Arm, Patient Position: Sitting, Cuff Size: Large)   Pulse 61   Temp 97.9 F (36.6 C) (Oral)   Ht 5\' 7"  (1.702 m)   Wt 144 lb (65.3 kg)   SpO2 97%   BMI 22.55 kg/m   BP Readings from Last 3 Encounters:  11/29/22 126/70  10/18/22 122/77  11/02/21 136/84     Wt Readings from Last 3 Encounters:  11/29/22 144 lb (65.3 kg)  10/18/22 146 lb 3.2 oz (66.3 kg)  11/02/21 142 lb (64.4 kg)    Physical Exam Vitals reviewed.  Constitutional:      Appearance: Normal appearance.  HENT:     Right Ear: Hearing, tympanic membrane, ear canal and external ear normal.     Left Ear: Hearing, tympanic membrane, ear canal and external ear normal.     Mouth/Throat:     Mouth: Mucous membranes are moist.  Eyes:     General: No scleral icterus.    Extraocular Movements: Extraocular movements intact.     Right eye: Normal extraocular motion and no nystagmus.     Left eye: Normal extraocular motion and no nystagmus.     Pupils: Pupils are equal, round, and reactive to light.  Cardiovascular:     Rate and Rhythm: Normal rate and regular rhythm.     Pulses: Normal pulses.     Heart sounds: No murmur heard.    No gallop.  Pulmonary:     Effort: Pulmonary effort is normal.     Breath sounds: No stridor. No wheezing, rhonchi or rales.  Abdominal:     General: Abdomen is flat.     Palpations: There is no mass.     Tenderness: There is no abdominal tenderness. There is no guarding.     Hernia: No hernia is present.  Musculoskeletal:        General: Normal range of motion.     Cervical back: Neck supple.     Right lower leg: No edema.     Left lower leg: No edema.  Lymphadenopathy:     Cervical: No cervical adenopathy.  Skin:    General: Skin is warm and dry.     Coloration: Skin is not pale.     Findings: No rash.  Neurological:     General: No focal deficit present.     Mental Status: She is alert.     Cranial Nerves: Cranial nerves 2-12 are intact. No cranial nerve deficit.     Sensory: No sensory deficit.     Motor: Motor function is intact. No weakness.     Coordination: Romberg sign negative. Coordination normal. Finger-Nose-Finger Test and Heel to [facility] Test normal. Rapid alternating movements normal.     Gait: Gait is intact. Gait normal.      Deep Tendon Reflexes: Reflexes normal. Babinski sign absent on the right side. Babinski sign absent on the left side.     Reflex Scores:      Tricep reflexes are 1+ on the right side and 1+ on the left side.      Bicep reflexes are 1+ on the right side and 1+ on the left side.      Brachioradialis reflexes are 1+ on the right side and 1+ on the left side.      Patellar reflexes are 2+ on the right side and 2+ on the left side.      Achilles reflexes are 1+  on the right side and 1+ on the left side. Psychiatric:        Mood and Affect: Mood normal.     Lab Results  Component Value Date   WBC 6.0 10/18/2022   HGB 12.0 10/18/2022   HCT 36.5 10/18/2022   PLT 223 10/18/2022   GLUCOSE 109 (H) 10/18/2022   CHOL 142 10/18/2022   TRIG 69 10/18/2022   HDL 78 10/18/2022   LDLCALC 50 10/18/2022   ALT 24 10/18/2022   AST 17 10/18/2022   NA 140 10/18/2022   K 4.5 10/18/2022   CL 100 10/18/2022   CREATININE 0.55 (L) 10/18/2022   BUN 11 10/18/2022   CO2 28 10/18/2022   TSH 2.72 04/21/2020   INR 1.09 12/31/2015   HGBA1C 5.9 (H) 10/18/2022    No results found.  Assessment & Plan:   Labyrinthitis, viral, bilateral- Will treat with a course of methylprednisolone. -     methylPREDNISolone; TAKE AS DIRECTED  Dispense: 21 tablet; Refill: 0  ANXIETY DISORDER, GENERALIZED -     ALPRAZolam; TAKE ONE TABLET BY MOUTH TWICE A DAY AS NEEDED FOR ANXIETY  Dispense: 30 tablet; Refill: 5  Essential hypertension- Her blood pressure is well-controlled.     Follow-up: No follow-ups on file.  Sanda Linger, MD

## 2022-12-06 DIAGNOSIS — F4320 Adjustment disorder, unspecified: Secondary | ICD-10-CM | POA: Diagnosis not present

## 2022-12-28 DIAGNOSIS — F4320 Adjustment disorder, unspecified: Secondary | ICD-10-CM | POA: Diagnosis not present

## 2023-01-17 ENCOUNTER — Other Ambulatory Visit: Payer: Self-pay | Admitting: Internal Medicine

## 2023-01-17 DIAGNOSIS — J301 Allergic rhinitis due to pollen: Secondary | ICD-10-CM

## 2023-01-17 DIAGNOSIS — J4520 Mild intermittent asthma, uncomplicated: Secondary | ICD-10-CM

## 2023-01-18 DIAGNOSIS — F4320 Adjustment disorder, unspecified: Secondary | ICD-10-CM | POA: Diagnosis not present

## 2023-02-02 ENCOUNTER — Other Ambulatory Visit: Payer: Self-pay | Admitting: Cardiology

## 2023-02-02 DIAGNOSIS — I10 Essential (primary) hypertension: Secondary | ICD-10-CM

## 2023-02-22 DIAGNOSIS — F4320 Adjustment disorder, unspecified: Secondary | ICD-10-CM | POA: Diagnosis not present

## 2023-03-15 DIAGNOSIS — F4320 Adjustment disorder, unspecified: Secondary | ICD-10-CM | POA: Diagnosis not present

## 2023-03-18 ENCOUNTER — Other Ambulatory Visit: Payer: Self-pay | Admitting: Cardiology

## 2023-03-18 DIAGNOSIS — I2510 Atherosclerotic heart disease of native coronary artery without angina pectoris: Secondary | ICD-10-CM

## 2023-03-18 DIAGNOSIS — I10 Essential (primary) hypertension: Secondary | ICD-10-CM

## 2023-03-29 DIAGNOSIS — F4320 Adjustment disorder, unspecified: Secondary | ICD-10-CM | POA: Diagnosis not present

## 2023-04-09 DIAGNOSIS — F4320 Adjustment disorder, unspecified: Secondary | ICD-10-CM | POA: Diagnosis not present

## 2023-04-23 DIAGNOSIS — F4320 Adjustment disorder, unspecified: Secondary | ICD-10-CM | POA: Diagnosis not present

## 2023-04-29 ENCOUNTER — Other Ambulatory Visit: Payer: Self-pay | Admitting: Neurology

## 2023-04-29 DIAGNOSIS — B379 Candidiasis, unspecified: Secondary | ICD-10-CM

## 2023-04-29 MED ORDER — FLUCONAZOLE 150 MG PO TABS
150.0000 mg | ORAL_TABLET | Freq: Every day | ORAL | 11 refills | Status: DC | PRN
Start: 2023-04-29 — End: 2023-12-31

## 2023-05-10 DIAGNOSIS — F4320 Adjustment disorder, unspecified: Secondary | ICD-10-CM | POA: Diagnosis not present

## 2023-05-24 ENCOUNTER — Other Ambulatory Visit: Payer: Self-pay | Admitting: Internal Medicine

## 2023-05-24 DIAGNOSIS — J301 Allergic rhinitis due to pollen: Secondary | ICD-10-CM

## 2023-05-24 DIAGNOSIS — J4520 Mild intermittent asthma, uncomplicated: Secondary | ICD-10-CM

## 2023-06-05 ENCOUNTER — Other Ambulatory Visit: Payer: Self-pay | Admitting: Cardiology

## 2023-06-05 DIAGNOSIS — E7800 Pure hypercholesterolemia, unspecified: Secondary | ICD-10-CM

## 2023-06-06 MED ORDER — PRAVASTATIN SODIUM 40 MG PO TABS: 40.0000 mg | ORAL_TABLET | Freq: Every day | ORAL | 1 refills | Status: DC

## 2023-06-19 DIAGNOSIS — F4320 Adjustment disorder, unspecified: Secondary | ICD-10-CM | POA: Diagnosis not present

## 2023-07-12 DIAGNOSIS — F4320 Adjustment disorder, unspecified: Secondary | ICD-10-CM | POA: Diagnosis not present

## 2023-07-26 DIAGNOSIS — F4320 Adjustment disorder, unspecified: Secondary | ICD-10-CM | POA: Diagnosis not present

## 2023-08-09 DIAGNOSIS — F4320 Adjustment disorder, unspecified: Secondary | ICD-10-CM | POA: Diagnosis not present

## 2023-08-20 ENCOUNTER — Other Ambulatory Visit: Payer: Self-pay | Admitting: Internal Medicine

## 2023-08-20 DIAGNOSIS — J301 Allergic rhinitis due to pollen: Secondary | ICD-10-CM

## 2023-08-20 DIAGNOSIS — J4520 Mild intermittent asthma, uncomplicated: Secondary | ICD-10-CM

## 2023-08-27 ENCOUNTER — Other Ambulatory Visit: Payer: Self-pay | Admitting: Internal Medicine

## 2023-08-27 DIAGNOSIS — J301 Allergic rhinitis due to pollen: Secondary | ICD-10-CM

## 2023-08-27 DIAGNOSIS — J4520 Mild intermittent asthma, uncomplicated: Secondary | ICD-10-CM

## 2023-08-27 MED ORDER — MONTELUKAST SODIUM 10 MG PO TABS: 10.0000 mg | ORAL_TABLET | Freq: Every day | ORAL | 0 refills | Status: DC

## 2023-08-29 MED ORDER — MONTELUKAST SODIUM 10 MG PO TABS
10.0000 mg | ORAL_TABLET | Freq: Every day | ORAL | 0 refills | Status: DC
Start: 2023-08-29 — End: 2023-11-05

## 2023-09-06 DIAGNOSIS — F4320 Adjustment disorder, unspecified: Secondary | ICD-10-CM | POA: Diagnosis not present

## 2023-09-11 DIAGNOSIS — Z01419 Encounter for gynecological examination (general) (routine) without abnormal findings: Secondary | ICD-10-CM | POA: Diagnosis not present

## 2023-09-11 DIAGNOSIS — R87810 Cervical high risk human papillomavirus (HPV) DNA test positive: Secondary | ICD-10-CM | POA: Diagnosis not present

## 2023-09-11 DIAGNOSIS — Z1231 Encounter for screening mammogram for malignant neoplasm of breast: Secondary | ICD-10-CM | POA: Diagnosis not present

## 2023-09-11 LAB — HM MAMMOGRAPHY

## 2023-09-20 LAB — HM PAP SMEAR
HPV 16/18/45 genotyping: NEGATIVE
HPV, high-risk: POSITIVE

## 2023-10-03 DIAGNOSIS — F4320 Adjustment disorder, unspecified: Secondary | ICD-10-CM | POA: Diagnosis not present

## 2023-10-18 ENCOUNTER — Ambulatory Visit: Payer: BC Managed Care – PPO | Admitting: Cardiology

## 2023-10-29 DIAGNOSIS — F4320 Adjustment disorder, unspecified: Secondary | ICD-10-CM | POA: Diagnosis not present

## 2023-11-04 ENCOUNTER — Other Ambulatory Visit: Payer: Self-pay | Admitting: Internal Medicine

## 2023-11-04 DIAGNOSIS — J301 Allergic rhinitis due to pollen: Secondary | ICD-10-CM

## 2023-11-04 DIAGNOSIS — J4520 Mild intermittent asthma, uncomplicated: Secondary | ICD-10-CM

## 2023-11-06 ENCOUNTER — Other Ambulatory Visit: Payer: Self-pay | Admitting: Internal Medicine

## 2023-11-06 DIAGNOSIS — J0100 Acute maxillary sinusitis, unspecified: Secondary | ICD-10-CM | POA: Insufficient documentation

## 2023-11-06 MED ORDER — AMOXICILLIN-POT CLAVULANATE 875-125 MG PO TABS: 1.0000 | ORAL_TABLET | Freq: Two times a day (BID) | ORAL | 0 refills | Status: DC

## 2023-12-31 ENCOUNTER — Other Ambulatory Visit: Payer: Self-pay | Admitting: Internal Medicine

## 2023-12-31 DIAGNOSIS — J301 Allergic rhinitis due to pollen: Secondary | ICD-10-CM

## 2023-12-31 MED ORDER — AZELASTINE HCL 137 MCG/SPRAY NA SOLN: NASAL | 1 refills | Status: AC

## 2024-02-18 ENCOUNTER — Other Ambulatory Visit: Payer: Self-pay | Admitting: Internal Medicine

## 2024-02-19 ENCOUNTER — Other Ambulatory Visit: Payer: Self-pay | Admitting: Medical Genetics

## 2024-02-21 ENCOUNTER — Encounter: Admitting: Internal Medicine

## 2024-02-22 ENCOUNTER — Ambulatory Visit: Payer: Self-pay | Admitting: Internal Medicine

## 2024-02-22 ENCOUNTER — Encounter: Payer: Self-pay | Admitting: Internal Medicine

## 2024-02-22 ENCOUNTER — Other Ambulatory Visit: Payer: Self-pay | Admitting: Internal Medicine

## 2024-02-22 ENCOUNTER — Ambulatory Visit: Admitting: Internal Medicine

## 2024-02-22 VITALS — BP 122/68 | HR 64 | Temp 98.2°F | Resp 16 | Ht 67.0 in | Wt 146.6 lb

## 2024-02-22 DIAGNOSIS — Z0000 Encounter for general adult medical examination without abnormal findings: Secondary | ICD-10-CM | POA: Diagnosis not present

## 2024-02-22 DIAGNOSIS — D1801 Hemangioma of skin and subcutaneous tissue: Secondary | ICD-10-CM | POA: Insufficient documentation

## 2024-02-22 DIAGNOSIS — I2510 Atherosclerotic heart disease of native coronary artery without angina pectoris: Secondary | ICD-10-CM | POA: Diagnosis not present

## 2024-02-22 DIAGNOSIS — E785 Hyperlipidemia, unspecified: Secondary | ICD-10-CM | POA: Diagnosis not present

## 2024-02-22 DIAGNOSIS — L821 Other seborrheic keratosis: Secondary | ICD-10-CM | POA: Insufficient documentation

## 2024-02-22 DIAGNOSIS — J4520 Mild intermittent asthma, uncomplicated: Secondary | ICD-10-CM

## 2024-02-22 DIAGNOSIS — L905 Scar conditions and fibrosis of skin: Secondary | ICD-10-CM | POA: Insufficient documentation

## 2024-02-22 DIAGNOSIS — Z23 Encounter for immunization: Secondary | ICD-10-CM

## 2024-02-22 DIAGNOSIS — Z1159 Encounter for screening for other viral diseases: Secondary | ICD-10-CM | POA: Insufficient documentation

## 2024-02-22 DIAGNOSIS — R739 Hyperglycemia, unspecified: Secondary | ICD-10-CM

## 2024-02-22 DIAGNOSIS — L719 Rosacea, unspecified: Secondary | ICD-10-CM | POA: Insufficient documentation

## 2024-02-22 DIAGNOSIS — L814 Other melanin hyperpigmentation: Secondary | ICD-10-CM | POA: Insufficient documentation

## 2024-02-22 DIAGNOSIS — I10 Essential (primary) hypertension: Secondary | ICD-10-CM | POA: Diagnosis not present

## 2024-02-22 DIAGNOSIS — D225 Melanocytic nevi of trunk: Secondary | ICD-10-CM | POA: Insufficient documentation

## 2024-02-22 DIAGNOSIS — F411 Generalized anxiety disorder: Secondary | ICD-10-CM

## 2024-02-22 DIAGNOSIS — Z1211 Encounter for screening for malignant neoplasm of colon: Secondary | ICD-10-CM

## 2024-02-22 DIAGNOSIS — J301 Allergic rhinitis due to pollen: Secondary | ICD-10-CM

## 2024-02-22 LAB — LIPID PANEL
Cholesterol: 173 mg/dL (ref 0–200)
HDL: 75.5 mg/dL (ref >39.00)
LDL Cholesterol: 81 mg/dL (ref 0–99)
NonHDL: 97.17
Total CHOL/HDL Ratio: 2
Triglycerides: 83 mg/dL (ref 0.0–149.0)
VLDL: 16.6 mg/dL (ref 0.0–40.0)

## 2024-02-22 LAB — CBC WITH DIFFERENTIAL/PLATELET
Basophils Absolute: 0 K/uL (ref 0.0–0.1)
Basophils Relative: 0.3 % (ref 0.0–3.0)
Eosinophils Absolute: 0.1 K/uL (ref 0.0–0.7)
Eosinophils Relative: 1.1 % (ref 0.0–5.0)
HCT: 38.6 % (ref 36.0–46.0)
Hemoglobin: 12.8 g/dL (ref 12.0–15.0)
Lymphocytes Relative: 34.4 % (ref 12.0–46.0)
Lymphs Abs: 1.8 K/uL (ref 0.7–4.0)
MCHC: 33.1 g/dL (ref 30.0–36.0)
MCV: 87.2 fl (ref 78.0–100.0)
Monocytes Absolute: 0.5 K/uL (ref 0.1–1.0)
Monocytes Relative: 9.1 % (ref 3.0–12.0)
Neutro Abs: 2.9 K/uL (ref 1.4–7.7)
Neutrophils Relative %: 55.1 % (ref 43.0–77.0)
Platelets: 184 K/uL (ref 150.0–400.0)
RBC: 4.43 Mil/uL (ref 3.87–5.11)
RDW: 13.3 % (ref 11.5–15.5)
WBC: 5.3 K/uL (ref 4.0–10.5)

## 2024-02-22 LAB — BASIC METABOLIC PANEL WITH GFR
BUN: 12 mg/dL (ref 6–23)
CO2: 31 meq/L (ref 19–32)
Calcium: 9.3 mg/dL (ref 8.4–10.5)
Chloride: 99 meq/L (ref 96–112)
Creatinine, Ser: 0.56 mg/dL (ref 0.40–1.20)
GFR: 101.29 mL/min (ref >60.00)
Glucose, Bld: 98 mg/dL (ref 70–99)
Potassium: 4.3 meq/L (ref 3.5–5.1)
Sodium: 137 meq/L (ref 135–145)

## 2024-02-22 LAB — URINALYSIS, ROUTINE W REFLEX MICROSCOPIC
Bilirubin Urine: NEGATIVE
Hgb urine dipstick: NEGATIVE
Ketones, ur: NEGATIVE
Nitrite: NEGATIVE
Specific Gravity, Urine: 1.01 (ref 1.000–1.030)
Total Protein, Urine: NEGATIVE
Urine Glucose: NEGATIVE
Urobilinogen, UA: 0.2 (ref 0.0–1.0)
pH: 6.5 (ref 5.0–8.0)

## 2024-02-22 LAB — HEPATIC FUNCTION PANEL
ALT: 16 U/L (ref 0–35)
AST: 17 U/L (ref 0–37)
Albumin: 4.7 g/dL (ref 3.5–5.2)
Alkaline Phosphatase: 55 U/L (ref 39–117)
Bilirubin, Direct: 0.2 mg/dL (ref 0.0–0.3)
Total Bilirubin: 0.6 mg/dL (ref 0.2–1.2)
Total Protein: 7.5 g/dL (ref 6.0–8.3)

## 2024-02-22 LAB — HEMOGLOBIN A1C: Hgb A1c MFr Bld: 6.1 % (ref 4.6–6.5)

## 2024-02-22 LAB — TSH: TSH: 2.5 u[IU]/mL (ref 0.35–5.50)

## 2024-02-22 MED ORDER — MONTELUKAST SODIUM 10 MG PO TABS: 10.0000 mg | ORAL_TABLET | Freq: Every day | ORAL | 1 refills | Status: AC

## 2024-02-22 MED ORDER — ASPIRIN 81 MG PO TBEC: 81.0000 mg | DELAYED_RELEASE_TABLET | Freq: Every day | ORAL | 1 refills | Status: AC

## 2024-02-22 MED ORDER — OLMESARTAN MEDOXOMIL 20 MG PO TABS: 20.0000 mg | ORAL_TABLET | Freq: Every day | ORAL | 1 refills | Status: AC

## 2024-02-22 MED ORDER — ATORVASTATIN CALCIUM 40 MG PO TABS: 40.0000 mg | ORAL_TABLET | Freq: Every day | ORAL | 1 refills | Status: DC

## 2024-02-22 MED ORDER — PRAVASTATIN SODIUM 40 MG PO TABS: 40.0000 mg | ORAL_TABLET | Freq: Every day | ORAL | 1 refills | Status: DC

## 2024-02-22 MED ORDER — NEXLIZET 180-10 MG PO TABS: 1.0000 | ORAL_TABLET | Freq: Every day | ORAL | 1 refills | Status: DC

## 2024-02-22 MED ORDER — ALPRAZOLAM 0.25 MG PO TABS: 0.2500 mg | ORAL_TABLET | Freq: Two times a day (BID) | ORAL | 1 refills | Status: AC | PRN

## 2024-02-22 MED ORDER — AIRSUPRA 90-80 MCG/ACT IN AERO: 2.0000 | INHALATION_SPRAY | Freq: Four times a day (QID) | RESPIRATORY_TRACT | 1 refills | Status: AC | PRN

## 2024-02-22 NOTE — Patient Instructions (Signed)

## 2024-02-22 NOTE — Progress Notes (Unsigned)
 Subjective:  Patient ID: Alexandra Swanson, female    DOB: August 17, 1965  Age: 58 y.o. MRN: [id number]  CC: Annual Exam, Asthma, Coronary Artery Disease, Hypertension, and Hyperlipidemia   HPI Alexandra Swanson presents for a CPX and f/up -----  Discussed the use of AI scribe software for clinical note transcription with the patient, who gave verbal consent to proceed.  History of Present Illness Alexandra Swanson is a 58 year old female who presents with concerns related to menopause and weight management.  She is experiencing an increase in abdominal fat, which she describes as 'horrible'. She has been actively trying to manage it through dietary changes and increased physical activity, including walking her dog two miles every morning and incorporating weight exercises in the evenings.  She has had a stressful year due to personal circumstances, including her boyfriend's struggles with sobriety and a DUI incident, which has added to her stress levels. Despite these challenges, she feels she is stabilizing and has been attending Al-Anon meetings, which she finds beneficial.  Regarding respiratory health, she reports some asthma symptoms, particularly after exposure to dust at a music festival. She has not used an inhaler due to concerns about potential side effects like thrush. She wants an albuterol inhaler available for occasional use.  She has not had an EKG in about a year and a half. She reports no shortness of breath or exertional dyspnea, except for the incident at the music festival.  Her sleep has been affected by the stress of the past year, and she acknowledges experiencing depression and anxiety during this time. She has not had lab work done in about a year and is due for a pneumonia vaccine and a TDAP booster.     Outpatient Medications Prior to Visit  Medication Sig Dispense Refill   Azelastine  HCl 137 MCG/SPRAY SOLN INSTILL 1 SPRAY INTO EACH NOSTRIL TWICE DAILY AS DIRECTED. 90  mL 1   Biotin 1000 MCG tablet Take 1,000 mcg by mouth daily.     Cyanocobalamin  (VITAMIN B-12 PO) Take by mouth daily.     fexofenadine (ALLEGRA) 180 MG tablet Take 180 mg by mouth daily.     valACYclovir  (VALTREX ) 500 MG tablet Take 1 tablet (500 mg total) by mouth 2 (two) times daily. (Patient taking differently: Take 500 mg by mouth as needed.) 14 tablet 5   VITAMIN D  PO Take by mouth daily.     ALPRAZolam  (XANAX ) 0.25 MG tablet TAKE ONE TABLET BY MOUTH TWICE A DAY AS NEEDED FOR ANXIETY 30 tablet 5   aspirin  81 MG tablet Take 81 mg by mouth daily. (Patient taking differently: Take 162 mg by mouth daily.)     carvedilol  (COREG ) 6.25 MG tablet Take 1 tablet by mouth twice daily with meals. 180 tablet 3   montelukast  (SINGULAIR ) 10 MG tablet Take 1 tablet (10 mg total) by mouth daily. TAKE 1 TABLET (10 MG TOTAL) BY MOUTH DAILY. Patient to follow up with PCP prior to future refills 90 tablet 0   olmesartan -hydrochlorothiazide  (BENICAR  HCT) 20-12.5 MG tablet Take 1 tablet by mouth every morning. 90 tablet 3   pravastatin  (PRAVACHOL ) 40 MG tablet Take 1 tablet (40 mg total) by mouth daily. 90 tablet 1   neomycin -polymyxin b-dexamethasone (MAXITROL) 3.5-10000-0.1 SUSP Place 2 drops into both eyes every 6 (six) hours. 5 mL 2   No facility-administered medications prior to visit.    ROS Review of Systems  Constitutional:  Negative for appetite change, chills, diaphoresis, fatigue and fever.  HENT: Negative.    Respiratory: Negative.  Negative for cough, chest tightness, shortness of breath and wheezing.   Cardiovascular:  Negative for chest pain, palpitations and leg swelling.  Gastrointestinal: Negative.  Negative for abdominal pain, constipation, diarrhea, nausea and vomiting.  Genitourinary: Negative.  Negative for difficulty urinating.  Musculoskeletal: Negative.   Skin: Negative.   Neurological: Negative.  Negative for dizziness.  Hematological:  Negative for adenopathy. Does not  bruise/bleed easily.  Psychiatric/Behavioral:  Positive for sleep disturbance. Negative for behavioral problems, confusion, decreased concentration, dysphoric mood, hallucinations, self-injury and suicidal ideas. The patient is nervous/anxious. The patient is not hyperactive.     Objective:  BP 122/68 (BP Location: Left Arm, Patient Position: Sitting, Cuff Size: Normal)   Pulse 64   Temp 98.2 F (36.8 C) (Oral)   Resp 16   Ht 5' 7 (1.702 m)   Wt 146 lb 9.6 oz (66.5 kg)   SpO2 99%   BMI 22.96 kg/m   BP Readings from Last 3 Encounters:  02/22/24 122/68  11/29/22 126/70  10/18/22 122/77    Wt Readings from Last 3 Encounters:  02/22/24 146 lb 9.6 oz (66.5 kg)  11/29/22 144 lb (65.3 kg)  10/18/22 146 lb 3.2 oz (66.3 kg)    Physical Exam Vitals reviewed.  Constitutional:      Appearance: Normal appearance.  HENT:     Nose: Nose normal.     Mouth/Throat:     Mouth: Mucous membranes are moist.  Eyes:     General: No scleral icterus.    Conjunctiva/sclera: Conjunctivae normal.  Cardiovascular:     Rate and Rhythm: Normal rate and regular rhythm.     Heart sounds: No murmur heard.    No friction rub. No gallop.     Comments: EKG--- SR with 1st degree AV block, 64 bpm No LVH, Q waves, or ST/T wave changes  Unchanged  Pulmonary:     Effort: Pulmonary effort is normal.     Breath sounds: No stridor. No wheezing, rhonchi or rales.  Abdominal:     General: Abdomen is flat.     Palpations: There is no mass.     Tenderness: There is no abdominal tenderness. There is no guarding.     Hernia: No hernia is present.  Musculoskeletal:     Cervical back: Neck supple.     Right lower leg: No edema.     Left lower leg: No edema.  Lymphadenopathy:     Cervical: No cervical adenopathy.  Skin:    General: Skin is warm and dry.  Neurological:     General: No focal deficit present.     Mental Status: She is alert. Mental status is at baseline.  Psychiatric:        Attention and  Perception: Attention and perception normal.        Mood and Affect: Mood is anxious.        Speech: Speech normal.        Behavior: Behavior normal.        Thought Content: Thought content normal.        Cognition and Memory: Cognition normal.     Lab Results  Component Value Date   WBC 5.3 02/22/2024   HGB 12.8 02/22/2024   HCT 38.6 02/22/2024   PLT 184.0 02/22/2024   GLUCOSE 98 02/22/2024   CHOL 173 02/22/2024   TRIG 83.0 02/22/2024   HDL 75.50 02/22/2024   LDLCALC 81 02/22/2024   ALT 16 02/22/2024  AST 17 02/22/2024   NA 137 02/22/2024   K 4.3 02/22/2024   CL 99 02/22/2024   CREATININE 0.56 02/22/2024   BUN 12 02/22/2024   CO2 31 02/22/2024   TSH 2.50 02/22/2024   INR 1.09 12/31/2015   HGBA1C 6.1 02/22/2024    No results found.  Assessment & Plan:  Hyperlipidemia with target LDL less than 160- She has not achieved her LDL goal of 55. Will upgrade to a more potent statin. -     Lipid panel; Future -     TSH; Future -     Hepatic function panel; Future -     Atorvastatin  Calcium ; Take 1 tablet (40 mg total) by mouth daily.  Dispense: 90 tablet; Refill: 1  Essential hypertension- Her BP is over-controlled. EKG is negative for LVH. Will discontinue hydrochlorothiazide . -     Basic metabolic panel with GFR; Future -     CBC with Differential/Platelet; Future -     TSH; Future -     Urinalysis, Routine w reflex microscopic; Future -     EKG 12-Lead -     Olmesartan  Medoxomil; Take 1 tablet (20 mg total) by mouth daily.  Dispense: 90 tablet; Refill: 1 -     Carvedilol ; Take 1 tablet (6.25 mg total) by mouth 2 (two) times daily with a meal.  Dispense: 180 tablet; Refill: 1  Hyperglycemia -     Basic metabolic panel with GFR; Future -     Hemoglobin A1c; Future  Need for hepatitis C screening test -     Hepatitis C antibody; Future  Routine general medical examination at a health care facility- Exam completed, labs reviewed, vaccines reviewed and updated, cancer  screenings addressed, pt ed material was given.  -     Hepatitis C antibody; Future  ANXIETY DISORDER, GENERALIZED -     TSH; Future -     ALPRAZolam ; Take 1 tablet (0.25 mg total) by mouth 2 (two) times daily as needed for anxiety.  Dispense: 180 tablet; Refill: 1  Coronary artery disease involving native coronary artery of native heart without angina pectoris -     Lipid panel; Future -     EKG 12-Lead -     Aspirin ; Take 1 tablet (81 mg total) by mouth daily.  Dispense: 90 tablet; Refill: 1 -     Carvedilol ; Take 1 tablet (6.25 mg total) by mouth 2 (two) times daily with a meal.  Dispense: 180 tablet; Refill: 1 -     Atorvastatin  Calcium ; Take 1 tablet (40 mg total) by mouth daily.  Dispense: 90 tablet; Refill: 1  Mild intermittent asthma without complication -     Airsupra; Inhale 2 puffs into the lungs 4 (four) times daily as needed.  Dispense: 32.1 g; Refill: 1 -     Montelukast  Sodium; Take 1 tablet (10 mg total) by mouth daily. TAKE 1 TABLET (10 MG TOTAL) BY MOUTH DAILY. Patient to follow up with PCP prior to future refills  Dispense: 90 tablet; Refill: 1  Immunization due -     Tdap vaccine greater than or equal to 7yo IM -     Pneumococcal conjugate vaccine 20-valent  Screening for colon cancer -     Cologuard  Non-seasonal allergic rhinitis due to pollen -     Montelukast  Sodium; Take 1 tablet (10 mg total) by mouth daily. TAKE 1 TABLET (10 MG TOTAL) BY MOUTH DAILY. Patient to follow up with PCP prior to future refills  Dispense: 90 tablet; Refill: 1     Follow-up: Return in about 6 months (around 08/22/2024).  Debby Molt, MD

## 2024-02-23 ENCOUNTER — Other Ambulatory Visit: Payer: Self-pay | Admitting: Internal Medicine

## 2024-02-23 DIAGNOSIS — I2510 Atherosclerotic heart disease of native coronary artery without angina pectoris: Secondary | ICD-10-CM

## 2024-02-23 DIAGNOSIS — E785 Hyperlipidemia, unspecified: Secondary | ICD-10-CM

## 2024-02-23 MED ORDER — ATORVASTATIN CALCIUM 40 MG PO TABS: 40.0000 mg | ORAL_TABLET | Freq: Every day | ORAL | 1 refills | Status: AC

## 2024-02-26 NOTE — Telephone Encounter (Signed)
 No, just atorvastatin 

## 2024-03-04 ENCOUNTER — Other Ambulatory Visit: Payer: Self-pay | Admitting: Internal Medicine

## 2024-03-04 DIAGNOSIS — I10 Essential (primary) hypertension: Secondary | ICD-10-CM

## 2024-03-04 MED ORDER — INDAPAMIDE 1.25 MG PO TABS: 1.2500 mg | ORAL_TABLET | Freq: Every day | ORAL | 0 refills | Status: DC

## 2024-03-19 ENCOUNTER — Other Ambulatory Visit: Payer: Self-pay

## 2024-04-14 DIAGNOSIS — S46001A Unspecified injury of muscle(s) and tendon(s) of the rotator cuff of right shoulder, initial encounter: Secondary | ICD-10-CM | POA: Diagnosis not present

## 2024-04-14 DIAGNOSIS — S40011A Contusion of right shoulder, initial encounter: Secondary | ICD-10-CM | POA: Diagnosis not present

## 2024-04-17 ENCOUNTER — Other Ambulatory Visit: Payer: Self-pay

## 2024-05-11 ENCOUNTER — Other Ambulatory Visit: Payer: Self-pay | Admitting: Internal Medicine

## 2024-05-11 DIAGNOSIS — I10 Essential (primary) hypertension: Secondary | ICD-10-CM

## 2024-05-21 ENCOUNTER — Other Ambulatory Visit

## 2024-05-21 DIAGNOSIS — Z006 Encounter for examination for normal comparison and control in clinical research program: Secondary | ICD-10-CM

## 2024-05-21 LAB — GENECONNECT MOLECULAR SCREEN

## 2024-05-27 NOTE — Addendum Note (Signed)
 Addended by: DELPHINE BRUNO HERO on: 05/27/2024 01:01 PM   Modules accepted: Orders

## 2024-06-11 LAB — GENECONNECT MOLECULAR SCREEN: Genetic Analysis Overall Interpretation: NEGATIVE
